# Patient Record
Sex: Male | Born: 1970 | Race: White | Hispanic: No | Marital: Single | Smoking: Former smoker
Health system: Southern US, Community
[De-identification: ages and names within clinical notes are randomized; demographics above are authoritative.]

## PROBLEM LIST (undated history)

## (undated) DIAGNOSIS — F200 Paranoid schizophrenia: Secondary | ICD-10-CM

## (undated) HISTORY — PX: NO PAST SURGERIES: SHX2092

## (undated) HISTORY — DX: Paranoid schizophrenia: F20.0

---

## 2015-04-03 DIAGNOSIS — Z79899 Other long term (current) drug therapy: Secondary | ICD-10-CM | POA: Diagnosis not present

## 2015-04-12 DIAGNOSIS — J209 Acute bronchitis, unspecified: Secondary | ICD-10-CM | POA: Diagnosis not present

## 2015-05-13 DIAGNOSIS — Z79899 Other long term (current) drug therapy: Secondary | ICD-10-CM | POA: Diagnosis not present

## 2015-05-20 DIAGNOSIS — F209 Schizophrenia, unspecified: Secondary | ICD-10-CM | POA: Diagnosis not present

## 2015-06-17 DIAGNOSIS — Z79899 Other long term (current) drug therapy: Secondary | ICD-10-CM | POA: Diagnosis not present

## 2015-07-16 DIAGNOSIS — Z79899 Other long term (current) drug therapy: Secondary | ICD-10-CM | POA: Diagnosis not present

## 2015-08-19 DIAGNOSIS — Z79899 Other long term (current) drug therapy: Secondary | ICD-10-CM | POA: Diagnosis not present

## 2015-09-24 DIAGNOSIS — F209 Schizophrenia, unspecified: Secondary | ICD-10-CM | POA: Diagnosis not present

## 2015-09-24 DIAGNOSIS — Z79899 Other long term (current) drug therapy: Secondary | ICD-10-CM | POA: Diagnosis not present

## 2015-10-24 DIAGNOSIS — Z79899 Other long term (current) drug therapy: Secondary | ICD-10-CM | POA: Diagnosis not present

## 2015-11-27 DIAGNOSIS — Z79899 Other long term (current) drug therapy: Secondary | ICD-10-CM | POA: Diagnosis not present

## 2015-12-31 DIAGNOSIS — Z79899 Other long term (current) drug therapy: Secondary | ICD-10-CM | POA: Diagnosis not present

## 2016-01-28 DIAGNOSIS — F209 Schizophrenia, unspecified: Secondary | ICD-10-CM | POA: Diagnosis not present

## 2016-02-03 DIAGNOSIS — J209 Acute bronchitis, unspecified: Secondary | ICD-10-CM | POA: Diagnosis not present

## 2016-02-03 DIAGNOSIS — Z79899 Other long term (current) drug therapy: Secondary | ICD-10-CM | POA: Diagnosis not present

## 2016-03-04 DIAGNOSIS — Z79899 Other long term (current) drug therapy: Secondary | ICD-10-CM | POA: Diagnosis not present

## 2016-04-06 DIAGNOSIS — Z79899 Other long term (current) drug therapy: Secondary | ICD-10-CM | POA: Diagnosis not present

## 2016-04-14 ENCOUNTER — Other Ambulatory Visit: Payer: Self-pay | Admitting: Family Medicine

## 2016-04-14 ENCOUNTER — Ambulatory Visit (INDEPENDENT_AMBULATORY_CARE_PROVIDER_SITE_OTHER): Payer: PPO | Admitting: Family Medicine

## 2016-04-14 ENCOUNTER — Encounter: Payer: Self-pay | Admitting: Family Medicine

## 2016-04-14 ENCOUNTER — Telehealth: Payer: Self-pay | Admitting: Family Medicine

## 2016-04-14 ENCOUNTER — Encounter (INDEPENDENT_AMBULATORY_CARE_PROVIDER_SITE_OTHER): Payer: Self-pay

## 2016-04-14 ENCOUNTER — Ambulatory Visit (INDEPENDENT_AMBULATORY_CARE_PROVIDER_SITE_OTHER): Payer: PPO

## 2016-04-14 VITALS — BP 111/75 | HR 113 | Temp 98.1°F | Ht 67.0 in | Wt 213.6 lb

## 2016-04-14 DIAGNOSIS — R05 Cough: Secondary | ICD-10-CM

## 2016-04-14 DIAGNOSIS — J189 Pneumonia, unspecified organism: Secondary | ICD-10-CM

## 2016-04-14 DIAGNOSIS — R634 Abnormal weight loss: Secondary | ICD-10-CM | POA: Diagnosis not present

## 2016-04-14 DIAGNOSIS — J181 Lobar pneumonia, unspecified organism: Principal | ICD-10-CM

## 2016-04-14 DIAGNOSIS — R059 Cough, unspecified: Secondary | ICD-10-CM | POA: Insufficient documentation

## 2016-04-14 LAB — COMPREHENSIVE METABOLIC PANEL
ALBUMIN: 3.3 g/dL — AB (ref 3.5–5.2)
ALT: 12 U/L (ref 0–53)
AST: 10 U/L (ref 0–37)
Alkaline Phosphatase: 95 U/L (ref 39–117)
BUN: 4 mg/dL — AB (ref 6–23)
CO2: 27 mEq/L (ref 19–32)
Calcium: 9.1 mg/dL (ref 8.4–10.5)
Chloride: 96 mEq/L (ref 96–112)
Creatinine, Ser: 0.55 mg/dL (ref 0.40–1.50)
GFR: 170.53 mL/min (ref >60.00)
Glucose, Bld: 443 mg/dL — ABNORMAL HIGH (ref 70–99)
POTASSIUM: 3.9 meq/L (ref 3.5–5.1)
SODIUM: 130 meq/L — AB (ref 135–145)
Total Bilirubin: 0.6 mg/dL (ref 0.2–1.2)
Total Protein: 7 g/dL (ref 6.0–8.3)

## 2016-04-14 LAB — LIPID PANEL
CHOLESTEROL: 138 mg/dL (ref 0–200)
HDL: 28.4 mg/dL — AB (ref >39.00)
LDL CALC: 73 mg/dL (ref 0–99)
NonHDL: 109.13
TRIGLYCERIDES: 183 mg/dL — AB (ref 0.0–149.0)
Total CHOL/HDL Ratio: 5
VLDL: 36.6 mg/dL (ref 0.0–40.0)

## 2016-04-14 LAB — SEDIMENTATION RATE: Sed Rate: 127 mm/hr — ABNORMAL HIGH (ref 0–15)

## 2016-04-14 LAB — CBC
HCT: 34.2 % — ABNORMAL LOW (ref 39.0–52.0)
Hemoglobin: 10.9 g/dL — ABNORMAL LOW (ref 13.0–17.0)
MCHC: 32 g/dL (ref 30.0–36.0)
MCV: 81.1 fl (ref 78.0–100.0)
PLATELETS: 483 10*3/uL — AB (ref 150.0–400.0)
RBC: 4.21 Mil/uL — ABNORMAL LOW (ref 4.22–5.81)
RDW: 16.1 % — ABNORMAL HIGH (ref 11.5–15.5)
WBC: 11.9 10*3/uL — ABNORMAL HIGH (ref 4.0–10.5)

## 2016-04-14 LAB — HEMOGLOBIN A1C: Hgb A1c MFr Bld: 15.2 % — ABNORMAL HIGH (ref 4.6–6.5)

## 2016-04-14 LAB — TSH: TSH: 0.83 u[IU]/mL (ref 0.35–4.50)

## 2016-04-14 LAB — PSA: PSA: 0.39 ng/mL (ref 0.10–4.00)

## 2016-04-14 MED ORDER — AMOXICILLIN-POT CLAVULANATE ER 1000-62.5 MG PO TB12: 2.0000 | ORAL_TABLET | Freq: Two times a day (BID) | ORAL | 0 refills | Status: DC

## 2016-04-14 MED ORDER — DOXYCYCLINE HYCLATE 100 MG PO TABS: 100.0000 mg | ORAL_TABLET | Freq: Two times a day (BID) | ORAL | 0 refills | Status: DC

## 2016-04-14 NOTE — Telephone Encounter (Signed)
Patient's father called and informed of xray results - RUL pneumonia. I am concerned about underlying malignancy in context of other symptoms and appearance of xray. Discussed with pulm, Dr. Belia HemanKasa who agrees. Starting on antibiotic and arranging CT chest tomorrow.

## 2016-04-14 NOTE — Assessment & Plan Note (Signed)
New problem. Uncertain etiology at this time. Concern for unintentional weight loss. He has made some adjustments as far as portion control but has made no other changes. Patient as well as his father are concerned that the amount of weight loss exceeds his adjustments regarding portion control. No other alarming or red flag symptoms. Obtaining laboratory workup. See orders. Also obtaining chest x-ray.

## 2016-04-14 NOTE — Progress Notes (Signed)
Subjective:  Patient ID: Bobby Clements, male    DOB: 1970/03/19  Age: 46 y.o. MRN: [id number]  CC: Establish care - Weight loss, cough  HPI Bobby Clements is a 46 y.o. male presents to the clinic today with the above issues.  Weight loss  Patient accompanied by his adopted father today.  Patient and father report he's had a 50 pound weight loss over the past year.  Patient states that he has cut back on his portion sizes but has made no other changes to prompt weight loss.  He does not exercise.  He and his father are concerned about the extent of his weight loss although it was needed.  They concern about underlying reasons for unintentional weight loss  No reports of fevers or chills or night sweats.  No abdominal pain, hematochezia, melena.  He is a smoker.  He has had an ongoing cough for the past month. See review of systems for other reported symptoms.  He is followed closely by psychiatry. No reports of other laboratory studies other than his monthly CBC due to clozapine use.  Cough  Patient has had a cough for the past month.  Nonproductive.  He is a smoker.  He has had some intermittent right-sided chest pain as well. Does not appear to be associated with exertion. No no relieving factors. Sometimes associated with cough.  Cough comes and goes.  No medications or interventions tried.  PMH, Surgical Hx, Family Hx, Social History reviewed and updated as below.  Past Medical History:  Diagnosis Date  . Paranoid schizophrenia [facility])    Past Surgical History:  Procedure Laterality Date  . NO PAST SURGERIES     Family History  Problem Relation Age of Onset  . Adopted: Yes   Social History  Substance Use Topics  . Smoking status: Current Every Day Smoker  . Smokeless tobacco: Never Used  . Alcohol use No   Review of Systems  Constitutional:       Weight loss.  Respiratory: Positive for cough.   Cardiovascular: Positive for chest pain.    Gastrointestinal: Positive for constipation.  Genitourinary: Positive for frequency.  Neurological: Positive for weakness.  Psychiatric/Behavioral:       Stress.  All other systems reviewed and are negative.  Objective:   Today's Vitals: BP 111/75   Pulse (!) 113   Temp 98.1 F (36.7 C) (Oral)   Ht _0  (1.702 m)   Wt 213 lb 9.6 oz (96.9 kg)   SpO2 98%   BMI 33.45 kg/m   Physical Exam  Constitutional: He is oriented to person, place, and time. He appears well-developed. No distress.  HENT:  Head: Normocephalic and atraumatic.  Mouth/Throat: Oropharynx is clear and moist.  TMs partially obscured by cerumen bilaterally.  Neck: Neck supple.  Cardiovascular: Regular rhythm.   Tachycardia.  Pulmonary/Chest: Effort normal and breath sounds normal. He has no wheezes. He has no rales.  Abdominal: Soft. He exhibits no distension.  No discrete area of tenderness (patient does not like to be touched).  Musculoskeletal: Normal range of motion.  Lymphadenopathy:    He has no cervical adenopathy.  Neurological: He is alert and oriented to person, place, and time.  Skin: Skin is warm. No rash noted.  Psychiatric:  Flat affect. Depressed mood. Minimal eye contact.   Vitals reviewed.  Assessment & Plan:   Problem List Items Addressed This Visit    Weight loss - Primary    New problem. Uncertain  etiology at this time. Concern for unintentional weight loss. He has made some adjustments as far as portion control but has made no other changes. Patient as well as his father are concerned that the amount of weight loss exceeds his adjustments regarding portion control. No other alarming or red flag symptoms. Obtaining laboratory workup. See orders. Also obtaining chest x-ray.       Relevant Orders   CBC   Hemoglobin A1c   Comprehensive metabolic panel   Lipid panel   TSH   Sed Rate (ESR)   HIV antibody   Hepatitis C Antibody   PSA   DG Chest 2 View   Cough    Prompting  her next line exam unremarkable. Obtaining chest x-ray given persistence of symptoms and concerns regarding weight loss.      Relevant Orders   DG Chest 2 View     Outpatient Encounter Prescriptions as of 04/14/2016  Medication Sig  . cloZAPine (CLOZARIL) 100 MG tablet Take 100 mg by mouth 2 (two) times daily. Take one tablet in the morning and two tablets at night  . FLUoxetine (PROZAC) 40 MG capsule Take 40 mg by mouth at bedtime.    No facility-administered encounter medications on file as of 04/14/2016.     Follow-up: Pending lab and xray results  [place]

## 2016-04-14 NOTE — Assessment & Plan Note (Signed)
Prompting her next line exam unremarkable. Obtaining chest x-ray given persistence of symptoms and concerns regarding weight loss.

## 2016-04-14 NOTE — Patient Instructions (Signed)
We will call with the lab results.  We will schedule follow up after the results return.  Continue your meds.  Take care  Dr. Adriana Simasook

## 2016-04-15 ENCOUNTER — Other Ambulatory Visit: Payer: PPO

## 2016-04-15 ENCOUNTER — Encounter: Payer: Self-pay | Admitting: Family Medicine

## 2016-04-15 ENCOUNTER — Ambulatory Visit
Admission: RE | Admit: 2016-04-15 | Discharge: 2016-04-15 | Disposition: A | Discharge status: Home / Self Care | Payer: PPO | Source: Ambulatory Visit | Attending: Family Medicine | Admitting: Family Medicine

## 2016-04-15 DIAGNOSIS — I2584 Coronary atherosclerosis due to calcified coronary lesion: Secondary | ICD-10-CM

## 2016-04-15 DIAGNOSIS — J850 Gangrene and necrosis of lung: Secondary | ICD-10-CM | POA: Diagnosis not present

## 2016-04-15 DIAGNOSIS — J189 Pneumonia, unspecified organism: Secondary | ICD-10-CM

## 2016-04-15 DIAGNOSIS — R161 Splenomegaly, not elsewhere classified: Secondary | ICD-10-CM | POA: Diagnosis not present

## 2016-04-15 DIAGNOSIS — R918 Other nonspecific abnormal finding of lung field: Secondary | ICD-10-CM | POA: Diagnosis not present

## 2016-04-15 DIAGNOSIS — I313 Pericardial effusion (noninflammatory): Secondary | ICD-10-CM | POA: Insufficient documentation

## 2016-04-15 DIAGNOSIS — D649 Anemia, unspecified: Secondary | ICD-10-CM | POA: Insufficient documentation

## 2016-04-15 DIAGNOSIS — J470 Bronchiectasis with acute lower respiratory infection: Secondary | ICD-10-CM | POA: Insufficient documentation

## 2016-04-15 DIAGNOSIS — I2510 Atherosclerotic heart disease of native coronary artery without angina pectoris: Secondary | ICD-10-CM | POA: Insufficient documentation

## 2016-04-15 DIAGNOSIS — E118 Type 2 diabetes mellitus with unspecified complications: Secondary | ICD-10-CM | POA: Insufficient documentation

## 2016-04-15 DIAGNOSIS — J181 Lobar pneumonia, unspecified organism: Secondary | ICD-10-CM | POA: Insufficient documentation

## 2016-04-15 DIAGNOSIS — E119 Type 2 diabetes mellitus without complications: Secondary | ICD-10-CM | POA: Insufficient documentation

## 2016-04-15 LAB — HEPATITIS C ANTIBODY: HCV Ab: NEGATIVE

## 2016-04-15 LAB — HIV ANTIBODY (ROUTINE TESTING W REFLEX): HIV 1&2 Ab, 4th Generation: NONREACTIVE

## 2016-04-17 ENCOUNTER — Ambulatory Visit (INDEPENDENT_AMBULATORY_CARE_PROVIDER_SITE_OTHER): Payer: PPO | Admitting: Family Medicine

## 2016-04-17 ENCOUNTER — Encounter: Payer: Self-pay | Admitting: Family Medicine

## 2016-04-17 VITALS — BP 109/77 | HR 108 | Temp 98.5°F | Wt 212.8 lb

## 2016-04-17 DIAGNOSIS — E119 Type 2 diabetes mellitus without complications: Secondary | ICD-10-CM | POA: Diagnosis not present

## 2016-04-17 DIAGNOSIS — D649 Anemia, unspecified: Secondary | ICD-10-CM | POA: Diagnosis not present

## 2016-04-17 DIAGNOSIS — J181 Lobar pneumonia, unspecified organism: Secondary | ICD-10-CM

## 2016-04-17 DIAGNOSIS — R634 Abnormal weight loss: Secondary | ICD-10-CM | POA: Diagnosis not present

## 2016-04-17 DIAGNOSIS — J189 Pneumonia, unspecified organism: Secondary | ICD-10-CM

## 2016-04-17 DIAGNOSIS — Z111 Encounter for screening for respiratory tuberculosis: Secondary | ICD-10-CM | POA: Diagnosis not present

## 2016-04-17 DIAGNOSIS — F200 Paranoid schizophrenia: Secondary | ICD-10-CM | POA: Insufficient documentation

## 2016-04-17 LAB — CBC
HCT: 33 % — ABNORMAL LOW (ref 39.0–52.0)
Hemoglobin: 10.7 g/dL — ABNORMAL LOW (ref 13.0–17.0)
MCHC: 32.4 g/dL (ref 30.0–36.0)
MCV: 80.6 fl (ref 78.0–100.0)
PLATELETS: 501 10*3/uL — AB (ref 150.0–400.0)
RBC: 4.09 Mil/uL — AB (ref 4.22–5.81)
RDW: 16.5 % — ABNORMAL HIGH (ref 11.5–15.5)
WBC: 9.2 10*3/uL (ref 4.0–10.5)

## 2016-04-17 LAB — FOLATE: FOLATE: 13.2 ng/mL (ref >5.9)

## 2016-04-17 LAB — VITAMIN B12: Vitamin B-12: 735 pg/mL (ref 211–911)

## 2016-04-17 MED ORDER — BASAGLAR KWIKPEN 100 UNIT/ML ~~LOC~~ SOPN: PEN_INJECTOR | SUBCUTANEOUS | 0 refills | Status: DC

## 2016-04-17 MED ORDER — METFORMIN HCL 500 MG PO TABS: 500.0000 mg | ORAL_TABLET | Freq: Two times a day (BID) | ORAL | 3 refills | Status: DC

## 2016-04-17 NOTE — Assessment & Plan Note (Signed)
Likely secondary to underlying diabetes and ongoing infection.

## 2016-04-17 NOTE — Assessment & Plan Note (Signed)
Additional lab studies today.

## 2016-04-17 NOTE — Assessment & Plan Note (Signed)
New problem. Tolerating antibiotic. Given CT findings and presentation, I discussed this case with Dr. Sung AmabileSimonds, pulmonology. He expressed concern about TB. Will arrange for Quanterferon gold. If negative will need bronchoscopy per his rectum-good We'll continue with treatment for CAP at this time.

## 2016-04-17 NOTE — Progress Notes (Signed)
Subjective:  Patient ID: Bobby Clements, male    DOB: 02-06-1971  Age: 46 y.o. MRN: [id number]  CC: Follow up  HPI:  46 year old male with paranoid schizophrenia presents for follow-up.  Patient was recently seen on 1/30 to establish care. He had complaints of weight loss and cough.  Chest xray was obtained and revealed RUL pneumonia. Given appearance and weight loss, CT was obtained. It revealed. Dense right upper lobe pneumonia and evidence of bronchiectasis. Also revealed right middle and lower lobe opacity. Additionally, there was a nodular opacity left upper lobe. Lymphadenopathy was noted and was thought to be reactive. Additionally, splenomegaly was noted. He was started on treatment for CAP with Augmentin and Doxy.  His laboratory studies returned revealing new-onset diabetes with A1c of 15.2. It also revealed leukocytosis, thrombocytosis and mild anemia.   Patient presents today for follow-up. Patient reports cough but states that he is "tired". Tolerating antibiotics. Father states that he's been compliant with his medication. No fever. No chills. No reports of shortness of breath. Patient has no other complaints at this time. We will discuss the above in detail.   Social Hx   Social History   Social History  . Marital status: Single    Spouse name: N/A  . Number of children: N/A  . Years of education: N/A   Social History Main Topics  . Smoking status: Current Every Day Smoker  . Smokeless tobacco: Never Used  . Alcohol use No  . Drug use: No  . Sexual activity: No   Other Topics Concern  . None   Social History Narrative  . None    Review of Systems  Constitutional: Negative for fever.  Respiratory: Positive for cough.    Objective:  BP 109/77   Pulse (!) 108   Temp 98.5 F (36.9 C) (Oral)   Wt 212 lb 12.8 oz (96.5 kg)   SpO2 98%   BMI 33.33 kg/m   BP/Weight 04/17/2016 04/14/2016  Systolic BP 109 111  Diastolic BP 77 75  Wt. (Lbs) 212.8 213.6  BMI  33.33 33.45   Physical Exam  Constitutional: He appears well-developed. No distress.  HENT:  Head: Normocephalic and atraumatic.  Pulmonary/Chest: Effort normal.  Neurological: He is alert.  Psychiatric:  Slightly paranoid/perseverative today.  Vitals reviewed.  Lab Results  Component Value Date   WBC 11.9 (H) 04/14/2016   HGB 10.9 (L) 04/14/2016   HCT 34.2 (L) 04/14/2016   PLT 483.0 (H) 04/14/2016   GLUCOSE 443 (H) 04/14/2016   CHOL 138 04/14/2016   TRIG 183.0 (H) 04/14/2016   HDL 28.40 (L) 04/14/2016   LDLCALC 73 04/14/2016   ALT 12 04/14/2016   AST 10 04/14/2016   NA 130 (L) 04/14/2016   K 3.9 04/14/2016   CL 96 04/14/2016   CREATININE 0.55 04/14/2016   BUN 4 (L) 04/14/2016   CO2 27 04/14/2016   TSH 0.83 04/14/2016   PSA 0.39 04/14/2016   HGBA1C 15.2 (H) 04/14/2016    Assessment & Plan:   Problem List Items Addressed This Visit    Weight loss    Likely secondary to underlying diabetes and ongoing infection.      New onset type 2 diabetes mellitus (HCC)    Starting metformin and basaglar today.       Relevant Medications   metFORMIN (GLUCOPHAGE) 500 MG tablet   Insulin Glargine (BASAGLAR KWIKPEN) 100 UNIT/ML SOPN   CAP (community acquired pneumonia) - Primary    New problem. Tolerating  antibiotic. Given CT findings and presentation, I discussed this case with Dr. Sung AmabileSimonds, pulmonology. He expressed concern about TB. Will arrange for Quanterferon gold. If negative will need bronchoscopy per his rectum-good We'll continue with treatment for CAP at this time.       Anemia    Additional lab studies today.      Relevant Orders   CBC   Iron, TIBC and Ferritin Panel   B12   Folate    Other Visit Diagnoses    Screening-pulmonary TB       Relevant Orders   Quantiferon tb gold assay     Meds ordered this encounter  Medications  . metFORMIN (GLUCOPHAGE) 500 MG tablet    Sig: Take 1 tablet (500 mg total) by mouth 2 (two) times daily with a meal.     Dispense:  180 tablet    Refill:  3  . Insulin Glargine (BASAGLAR KWIKPEN) 100 UNIT/ML SOPN    Sig: 20 units daily. Increase per physician recommendations.    Dispense:  2 pen    Refill:  0    Follow-up: 1 week.  Everlene OtherJayce Trong Gosling DO [facility] [place]

## 2016-04-17 NOTE — Patient Instructions (Addendum)
Start at 20 units (daily).  Increase by 2 units daily until his sugars are below 150.   I will see him back next week.  Take care  Dr. Adriana Clements   Diabetes Mellitus and Food It is important for you to manage your blood sugar (glucose) level. Your blood glucose level can be greatly affected by what you eat. Eating healthier foods in the appropriate amounts throughout the day at about the same time each day will help you control your blood glucose level. It can also help slow or prevent worsening of your diabetes mellitus. Healthy eating may even help you improve the level of your blood pressure and reach or maintain a healthy weight. General recommendations for healthful eating and cooking habits include:  Eating meals and snacks regularly. Avoid going long periods of time without eating to lose weight.  Eating a diet that consists mainly of plant-based foods, such as fruits, vegetables, nuts, legumes, and whole grains.  Using low-heat cooking methods, such as baking, instead of high-heat cooking methods, such as deep frying. Work with your dietitian to make sure you understand how to use the Nutrition Facts information on food labels. How can food affect me? Carbohydrates  Carbohydrates affect your blood glucose level more than any other type of food. Your dietitian will help you determine how many carbohydrates to eat at each meal and teach you how to count carbohydrates. Counting carbohydrates is important to keep your blood glucose at a healthy level, especially if you are using insulin or taking certain medicines for diabetes mellitus. Alcohol  Alcohol can cause sudden decreases in blood glucose (hypoglycemia), especially if you use insulin or take certain medicines for diabetes mellitus. Hypoglycemia can be a life-threatening condition. Symptoms of hypoglycemia (sleepiness, dizziness, and disorientation) are similar to symptoms of having too much alcohol. If your health care provider has  given you approval to drink alcohol, do so in moderation and use the following guidelines:  Women should not have more than one drink per day, and men should not have more than two drinks per day. One drink is equal to:  12 oz of beer.  5 oz of wine.  1 oz of hard liquor.  Do not drink on an empty stomach.  Keep yourself hydrated. Have water, diet soda, or unsweetened iced tea.  Regular soda, juice, and other mixers might contain a lot of carbohydrates and should be counted. What foods are not recommended? As you make food choices, it is important to remember that all foods are not the same. Some foods have fewer nutrients per serving than other foods, even though they might have the same number of calories or carbohydrates. It is difficult to get your body what it needs when you eat foods with fewer nutrients. Examples of foods that you should avoid that are high in calories and carbohydrates but low in nutrients include:  Trans fats (most processed foods list trans fats on the Nutrition Facts label).  Regular soda.  Juice.  Candy.  Sweets, such as cake, pie, doughnuts, and cookies.  Fried foods. What foods can I eat? Eat nutrient-rich foods, which will nourish your body and keep you healthy. The food you should eat also will depend on several factors, including:  The calories you need.  The medicines you take.  Your weight.  Your blood glucose level.  Your blood pressure level.  Your cholesterol level. You should eat a variety of foods, including:  Protein.  Lean cuts of meat.  Proteins  low in saturated fats, such as fish, egg whites, and beans. Avoid processed meats.  Fruits and vegetables.  Fruits and vegetables that may help control blood glucose levels, such as apples, mangoes, and yams.  Dairy products.  Choose fat-free or low-fat dairy products, such as milk, yogurt, and cheese.  Grains, bread, pasta, and rice.  Choose whole grain products, such  as multigrain bread, whole oats, and brown rice. These foods may help control blood pressure.  Fats.  Foods containing healthful fats, such as nuts, avocado, olive oil, canola oil, and fish. Does everyone with diabetes mellitus have the same meal plan? Because every person with diabetes mellitus is different, there is not one meal plan that works for everyone. It is very important that you meet with a dietitian who will help you create a meal plan that is just right for you. This information is not intended to replace advice given to you by your health care provider. Make sure you discuss any questions you have with your health care provider. Document Released: 11/27/2004 Document Revised: 08/08/2015 Document Reviewed: 01/27/2013 Elsevier Interactive Patient Education  2017 [employer].   Tips for Eating Away From Home If You Have Diabetes Controlling your level of blood glucose, also known as blood sugar, can be challenging. It can be even more difficult when you do not prepare your own meals. The following tips can help you manage your diabetes when you eat away from home. Planning ahead Plan ahead if you know you will be eating away from home:  Ask your health care provider how to time meals and medicine if you are taking insulin.  Make a list of restaurants near you that offer healthy choices. If they have a carry-out menu, take it home and plan what you will order ahead of time.  Look up the restaurant you want to eat at online. Many chain and fast-food restaurants list nutritional information online. Use this information to choose the healthiest options and to calculate how many carbohydrates will be in your meal.  Use a carbohydrate-counting book or mobile app to look up the carbohydrate content and serving size of the foods you want to eat.  Become familiar with serving sizes and learn to recognize how many servings are in a portion. This will allow you to estimate how many  carbohydrates you can eat. Free foods A "free food" is any food or drink that has less than 5 g of carbohydrates per serving. Free foods include:  Many vegetables.  Hard boiled eggs.  Nuts or seeds.  Olives.  Cheeses.  Meats. These types of foods make good appetizer choices and are often available at salad bars. Lemon juice, vinegar, or a low-calorie salad dressing of fewer than 20 calories per serving can be used as a "free" salad dressing. Choices to reduce carbohydrates  Substitute nonfat sweetened yogurt with a sugar-free yogurt. Yogurt made from soy milk may also be used, but you will still want a sugar-free or plain option to choose a lower carbohydrate amount.  Ask your server to take away the bread basket or chips from your table.  Order fresh fruit. A salad bar often offers fresh fruit choices. Avoid canned fruit because it is usually packed in sugar or syrup.  Order a salad, and eat it without dressing. Or, create a "free" salad dressing.  Ask for substitutions. For example, instead of [place] fries, request an order of a vegetable such as salad, green beans, or broccoli. Other tips  If  you take insulin, take the insulin once your food arrives to your table. This will ensure your insulin and food are timed correctly.  Ask your server about the portion size before your order, and ask for a take-out box if the portion has more servings than you should have. When your food comes, leave the amount you should have on the plate, and put the rest in the take-out box.  Consider splitting an entree with someone and ordering a side salad. This information is not intended to replace advice given to you by your health care provider. Make sure you discuss any questions you have with your health care provider. Document Released: 03/02/2005 Document Revised: 08/08/2015 Document Reviewed: 05/30/2013 Elsevier Interactive Patient Education  2017 [employer]

## 2016-04-17 NOTE — Assessment & Plan Note (Signed)
Starting metformin and basaglar today.

## 2016-04-18 LAB — IRON,TIBC AND FERRITIN PANEL
%SAT: 13 % — ABNORMAL LOW (ref 15–60)
Ferritin: 215 ng/mL (ref 20–380)
Iron: 26 ug/dL — ABNORMAL LOW (ref 50–180)
TIBC: 195 ug/dL — ABNORMAL LOW (ref 250–425)

## 2016-04-20 ENCOUNTER — Other Ambulatory Visit: Payer: Self-pay | Admitting: Family Medicine

## 2016-04-20 ENCOUNTER — Telehealth: Payer: Self-pay | Admitting: Family Medicine

## 2016-04-20 LAB — QUANTIFERON TB GOLD ASSAY (BLOOD)
INTERFERON GAMMA RELEASE ASSAY: NEGATIVE
MITOGEN-NIL SO: 1.73 [IU]/mL
QUANTIFERON NIL VALUE: 0.05 [IU]/mL
Quantiferon Tb Ag Minus Nil Value: <0 IU/mL

## 2016-04-20 MED ORDER — FERROUS SULFATE 325 (65 FE) MG PO TABS: 325.0000 mg | ORAL_TABLET | Freq: Two times a day (BID) | ORAL | 1 refills | Status: DC

## 2016-04-20 NOTE — Telephone Encounter (Signed)
Pt father Moise BoringDonnie called and needed to speak with you in regards to something that you were supposed to get back with him last week with. Please advise, thank you!  Call Donnie @ [phone]

## 2016-04-20 NOTE — Telephone Encounter (Signed)
pts mother was called per DPR. She was told we had not received results on the lab that was done on Friday. she was told we'd call once they were resulted.

## 2016-04-20 NOTE — Telephone Encounter (Signed)
pts mother was called and told that per [facility] it takes up to four days to run the test. Mother was advised that we would let them know right away when we received results.

## 2016-04-20 NOTE — Telephone Encounter (Signed)
Results received and given to mother.

## 2016-04-20 NOTE — Telephone Encounter (Signed)
Called pts father back was unable to leave voicemail.

## 2016-04-21 ENCOUNTER — Other Ambulatory Visit: Payer: Self-pay | Admitting: Family Medicine

## 2016-04-21 DIAGNOSIS — J189 Pneumonia, unspecified organism: Secondary | ICD-10-CM

## 2016-04-21 DIAGNOSIS — J181 Lobar pneumonia, unspecified organism: Principal | ICD-10-CM

## 2016-04-23 ENCOUNTER — Ambulatory Visit (INDEPENDENT_AMBULATORY_CARE_PROVIDER_SITE_OTHER): Payer: PPO | Admitting: Family Medicine

## 2016-04-23 ENCOUNTER — Encounter: Payer: Self-pay | Admitting: Family Medicine

## 2016-04-23 DIAGNOSIS — E119 Type 2 diabetes mellitus without complications: Secondary | ICD-10-CM

## 2016-04-23 DIAGNOSIS — J181 Lobar pneumonia, unspecified organism: Secondary | ICD-10-CM | POA: Diagnosis not present

## 2016-04-23 DIAGNOSIS — J189 Pneumonia, unspecified organism: Secondary | ICD-10-CM

## 2016-04-23 NOTE — Progress Notes (Signed)
   Subjective:  Patient ID: Bobby Clements, male    DOB: 07/29/1970  Age: 46 y.o. MRN: [id number]  CC: Follow up CAP, [id number]  HPI:  46 year old male with paranoid schizophrenia, recent CAP, and new-onset type 2 diabetes presents for follow-up.  CAP  Cough is improved. Patient states that he is feeling better at this time.  He has upcoming appointment with pulmonology for consultation given CT findings.  He has completed antibiotics.  DM-2 Blood sugars readings - Improving. That went from the 500s fasting to the 150's over the past few days.  Hypoglycemia - No. Medications - Metformin, Basaglar. Adverse effects - No. Compliance - Yes.   Social Hx   Social History   Social History  . Marital status: Single    Spouse name: N/A  . Number of children: N/A  . Years of education: N/A   Social History Main Topics  . Smoking status: Current Every Day Smoker  . Smokeless tobacco: Never Used  . Alcohol use No  . Drug use: No  . Sexual activity: No   Other Topics Concern  . None   Social History Narrative  . None   Review of Systems  Constitutional: Negative for fever.  Respiratory: Positive for cough.    Objective:  BP 104/71   Pulse 92   Temp 98.4 F (36.9 C) (Oral)   Wt 209 lb (94.8 kg)   SpO2 97%   BMI 32.73 kg/m   BP/Weight 04/23/2016 04/17/2016 04/14/2016  Systolic BP 104 109 111  Diastolic BP 71 77 75  Wt. (Lbs) 209 212.8 213.6  BMI 32.73 33.33 33.45   Physical Exam  Constitutional: He is oriented to person, place, and time. He appears well-developed. No distress.  Cardiovascular: Normal rate and regular rhythm.   Pulmonary/Chest: Effort normal and breath sounds normal. He has no wheezes. He has no rales.  Neurological: He is alert and oriented to person, place, and time.  Psychiatric:  Flat affect.  Vitals reviewed.  Lab Results  Component Value Date   WBC 9.2 04/17/2016   HGB 10.7 (L) 04/17/2016   HCT 33.0 (L) 04/17/2016   PLT 501.0 (H) 04/17/2016    GLUCOSE 443 (H) 04/14/2016   CHOL 138 04/14/2016   TRIG 183.0 (H) 04/14/2016   HDL 28.40 (L) 04/14/2016   LDLCALC 73 04/14/2016   ALT 12 04/14/2016   AST 10 04/14/2016   NA 130 (L) 04/14/2016   K 3.9 04/14/2016   CL 96 04/14/2016   CREATININE 0.55 04/14/2016   BUN 4 (L) 04/14/2016   CO2 27 04/14/2016   TSH 0.83 04/14/2016   PSA 0.39 04/14/2016   HGBA1C 15.2 (H) 04/14/2016    Assessment & Plan:   Problem List Items Addressed This Visit    CAP (community acquired pneumonia)    Cough improved. Stable and doing well at this time. Has completed antibiotics.  Seeing pulm tomorrow given CT findings. Had negative quanterferon gold.      DM (diabetes mellitus), type 2 (HCC)    Improving. Last 3 days fasting has been 140's - 150's. Currently on 30 units of Basaglar. Advised to continue and titrate up per my recommendations if CBG's >150. Continue Metformin.        Follow-up: 2 weeks  Everlene OtherJayce Torii Royse DO [facility] [place]

## 2016-04-23 NOTE — Progress Notes (Signed)
Pre visit review using our clinic review tool, if applicable. No additional management support is needed unless otherwise documented below in the visit note. 

## 2016-04-23 NOTE — Assessment & Plan Note (Addendum)
Improving. Last 3 days fasting has been 140's - 150's. Currently on 30 units of Basaglar. Advised to continue and titrate up per my recommendations if CBG's >150. Continue Metformin.

## 2016-04-23 NOTE — Patient Instructions (Signed)
Continue insulin as we discussed.  Follow up in 2 weeks.  Take care  Dr. Adriana Simasook

## 2016-04-23 NOTE — Assessment & Plan Note (Signed)
Cough improved. Stable and doing well at this time. Has completed antibiotics.  Seeing pulm tomorrow given CT findings. Had negative quanterferon gold.

## 2016-04-24 ENCOUNTER — Ambulatory Visit (INDEPENDENT_AMBULATORY_CARE_PROVIDER_SITE_OTHER): Payer: PPO | Admitting: Pulmonary Disease

## 2016-04-24 ENCOUNTER — Ambulatory Visit: Payer: PPO | Admitting: Family Medicine

## 2016-04-24 ENCOUNTER — Encounter: Payer: Self-pay | Admitting: Pulmonary Disease

## 2016-04-24 VITALS — BP 118/80 | HR 80 | Wt 208.0 lb

## 2016-04-24 DIAGNOSIS — J181 Lobar pneumonia, unspecified organism: Secondary | ICD-10-CM

## 2016-04-24 DIAGNOSIS — F17200 Nicotine dependence, unspecified, uncomplicated: Secondary | ICD-10-CM

## 2016-04-24 DIAGNOSIS — R911 Solitary pulmonary nodule: Secondary | ICD-10-CM

## 2016-04-24 DIAGNOSIS — J189 Pneumonia, unspecified organism: Secondary | ICD-10-CM

## 2016-04-24 NOTE — Progress Notes (Signed)
PULMONARY CONSULT NOTE  Requesting MD/Service: Adriana Simas Date of initial consultation: 04/24/16 Reason for consultation: RUL consolidation  PT PROFILE: 35 M smoker with schizophrenia (never institutionalized) referred for evaluation of RUL consolidation and concern raised for TB  HPI:  70 M smoker of 1/2 PPD initially developed symptoms of a "cold" in November 2017 with malaise, NP cough, no fever. These symptoms resolved. In January he again developed malaise, fatigue, cough productive of yellow mucus. He denies fever and hemoptysis. He underwent CXR revealing RUL consolidation and CT scan following that study. He has just completed treatment for CAP with Augmentin and doxycycline prescribed by Dr Adriana Simas. He indicates that he feels better with only vague R upper chest discomfort now (last episode 3 days ago). He has undergone quant gold test which was negative. He is referred for further evaluation and consideration of bronchoscopy.   Past Medical History:  Diagnosis Date  . Paranoid schizophrenia [facility])     Past Surgical History:  Procedure Laterality Date  . NO PAST SURGERIES      MEDICATIONS: I have reviewed all medications and confirmed regimen as documented  Social History   Social History  . Marital status: Single    Spouse name: N/A  . Number of children: N/A  . Years of education: N/A   Occupational History  . Not on file.   Social History Main Topics  . Smoking status: Current Every Day Smoker    Packs/day: 1.00  . Smokeless tobacco: Never Used  . Alcohol use No  . Drug use: No  . Sexual activity: No   Other Topics Concern  . Not on file   Social History Narrative  . No narrative on file    Family History  Problem Relation Age of Onset  . Adopted: Yes    ROS: No fever, myalgias/arthralgias, unexplained weight loss or weight gain No new focal weakness or sensory deficits No otalgia, hearing loss, visual changes, nasal and sinus symptoms, mouth and throat  problems No neck pain or adenopathy No abdominal pain, N/V/D, diarrhea, change in bowel pattern No dysuria, change in urinary pattern   Vitals:   04/24/16 1104  BP: 118/80  Pulse: 80  SpO2: 98%  Weight: 208 lb (94.3 kg)     EXAM:  Gen: Flat affect, WDWN, No overt respiratory distress HEENT: NCAT, sclera white, oropharynx normal Neck: Supple without LAN, thyromegaly, JVD Lungs: breath sounds full with bronchial quality in RUL anteriorly. No wheezes or other adventitious sounds Cardiovascular: RRR, no murmurs noted Abdomen: Soft, nontender, normal BS Ext: without clubbing, cyanosis, edema Neuro: CNs grossly intact, motor and sensory intact Skin: Limited exam, no lesions noted  DATA:   BMP Latest Ref Rng & Units 04/14/2016  Glucose 70 - 99 mg/dL 161(W)  BUN 6 - 23 mg/dL 4(L)  Creatinine 9.60 - 1.50 mg/dL 4.54  Sodium 098 - 119 mEq/L 130(L)  Potassium 3.5 - 5.1 mEq/L 3.9  Chloride 96 - 112 mEq/L 96  CO2 19 - 32 mEq/L 27  Calcium 8.4 - 10.5 mg/dL 9.1    CBC Latest Ref Rng & Units 04/17/2016 04/14/2016  WBC 4.0 - 10.5 K/uL 9.2 11.9(H)  Hemoglobin 13.0 - 17.0 g/dL 10.7(L) 10.9(L)  Hematocrit 39.0 - 52.0 % 33.0(L) 34.2(L)  Platelets 150.0 - 400.0 K/uL 501.0(H) 483.0(H)   Quantiferon gold 04/17/16: negative  CXR:    IMPRESSION:     ICD-9-CM ICD-10-CM   1. Community acquired pneumonia of right upper lobe of lung (HCC) 481 J18.1  2. Lung nodule, Left 793.11 R91.1   3. Smoker 305.1 F17.200    The negative quantiferon gold study and the clinical response to antibiotics directed @ CAP are encouraging  PLAN:  Follow up in 3-4 weeks with CXR. If RUL is clearing, we will consider this CAP. He will still need follow up for the L lung nodule with repeat CT chest in 3 months or so. Encouraged him to quit smoking   Billy Fischeravid Naidelyn Parrella, MD PCCM service Mobile [phone] Pager [phone] 04/24/2016

## 2016-04-24 NOTE — Patient Instructions (Signed)
Follow up in 3-4 weeks with chest Xray prior to that visit

## 2016-05-07 DIAGNOSIS — Z79899 Other long term (current) drug therapy: Secondary | ICD-10-CM | POA: Diagnosis not present

## 2016-05-08 ENCOUNTER — Ambulatory Visit (INDEPENDENT_AMBULATORY_CARE_PROVIDER_SITE_OTHER): Payer: PPO | Admitting: Family Medicine

## 2016-05-08 ENCOUNTER — Encounter: Payer: Self-pay | Admitting: Family Medicine

## 2016-05-08 ENCOUNTER — Ambulatory Visit (INDEPENDENT_AMBULATORY_CARE_PROVIDER_SITE_OTHER): Payer: PPO

## 2016-05-08 VITALS — BP 119/83 | HR 95 | Temp 97.7°F | Wt 207.4 lb

## 2016-05-08 DIAGNOSIS — J189 Pneumonia, unspecified organism: Secondary | ICD-10-CM | POA: Diagnosis not present

## 2016-05-08 DIAGNOSIS — D649 Anemia, unspecified: Secondary | ICD-10-CM | POA: Diagnosis not present

## 2016-05-08 DIAGNOSIS — E119 Type 2 diabetes mellitus without complications: Secondary | ICD-10-CM

## 2016-05-08 DIAGNOSIS — J181 Lobar pneumonia, unspecified organism: Secondary | ICD-10-CM

## 2016-05-08 MED ORDER — BASAGLAR KWIKPEN 100 UNIT/ML ~~LOC~~ SOPN: PEN_INJECTOR | SUBCUTANEOUS | 3 refills | Status: DC

## 2016-05-08 MED ORDER — INSULIN PEN NEEDLE 31G X 5 MM MISC: 4 refills | Status: DC

## 2016-05-08 NOTE — Progress Notes (Signed)
Pre visit review using our clinic review tool, if applicable. No additional management support is needed unless otherwise documented below in the visit note. 

## 2016-05-08 NOTE — Progress Notes (Signed)
Subjective:  Patient ID: Bobby LaymanJason G Nunley, male    DOB: 02/03/1971  Age: 46 y.o. MRN: [id number]  CC: Follow up  HPI:  46 year old male with paranoid schizophrenic, recent diagnosis of DM 2, recent community acquired pneumonia, and anemia presents for follow-up.  DM-2  Sugars markedly improved. Now ranging in the 70s to low 90s.  Compliant with insulin and metformin. He's taking basaglar 30 units daily.  No side effects. No hypoglycemia.  His recent weight loss has now stopped. His weight is essentially stable.  CAP  No further cough. He is feeling well.  Needs chest x-ray today.  Anemia  Anemia has been stable.  Tolerating iron therapy.  No reports of hematochezia or melena. Normal bowel movements.  Social Hx   Social History   Social History  . Marital status: Single    Spouse name: N/A  . Number of children: N/A  . Years of education: N/A   Social History Main Topics  . Smoking status: Current Every Day Smoker    Packs/day: 1.00  . Smokeless tobacco: Never Used  . Alcohol use No  . Drug use: No  . Sexual activity: No   Other Topics Concern  . None   Social History Narrative  . None    Review of Systems  Constitutional: Negative.   Respiratory: Negative for cough.    Objective:  BP 119/83   Pulse 95   Temp 97.7 F (36.5 C) (Oral)   Wt 207 lb 6.4 oz (94.1 kg)   SpO2 99%   BMI 32.48 kg/m   BP/Weight 05/08/2016 04/24/2016 04/23/2016  Systolic BP 119 118 104  Diastolic BP 83 80 71  Wt. (Lbs) 207.4 208 209  BMI 32.48 32.58 32.73   Physical Exam  Constitutional: He is oriented to person, place, and time. He appears well-developed. No distress.  Cardiovascular: Normal rate and regular rhythm.   Pulmonary/Chest: Effort normal and breath sounds normal.  Neurological: He is alert and oriented to person, place, and time.  Psychiatric:  Flat affect.  Vitals reviewed.   Lab Results  Component Value Date   WBC 9.2 04/17/2016   HGB 10.7 (L)  04/17/2016   HCT 33.0 (L) 04/17/2016   PLT 501.0 (H) 04/17/2016   GLUCOSE 443 (H) 04/14/2016   CHOL 138 04/14/2016   TRIG 183.0 (H) 04/14/2016   HDL 28.40 (L) 04/14/2016   LDLCALC 73 04/14/2016   ALT 12 04/14/2016   AST 10 04/14/2016   NA 130 (L) 04/14/2016   K 3.9 04/14/2016   CL 96 04/14/2016   CREATININE 0.55 04/14/2016   BUN 4 (L) 04/14/2016   CO2 27 04/14/2016   TSH 0.83 04/14/2016   PSA 0.39 04/14/2016   HGBA1C 15.2 (H) 04/14/2016    Assessment & Plan:   Problem List Items Addressed This Visit    DM (diabetes mellitus), type 2 (HCC) - Primary    Sugars very well controlled at this time. Decreasing insulin to 20 units daily. Follow-up in 2 months for A1c. His LDL is nearly at goal of less than 70 on no meds. Will consider statin future. Needs aspirin therapy secondary to tobacco abuse and DM 2. Will revisit at next visit. I am trying not to do so much at one time given the fact that it bothers him significantly due to his mental illness.      Relevant Medications   Insulin Glargine (BASAGLAR KWIKPEN) 100 UNIT/ML SOPN   CAP (community acquired pneumonia)    Doing  well. Reevaluating with chest x-ray today.      Relevant Orders   DG Chest 2 View   Anemia    Stable. Continue iron supplementation. Will recheck at next visit.         Meds ordered this encounter  Medications  . Insulin Pen Needle (B-D UF III MINI PEN NEEDLES) 31G X 5 MM MISC    Sig: Use as instructed to inject your insulin.    Dispense:  100 each    Refill:  4  . Insulin Glargine (BASAGLAR KWIKPEN) 100 UNIT/ML SOPN    Sig: 28 units daily.    Dispense:  5 pen    Refill:  3    Follow-up: 2 months; appt scheduled  Everlene Other DO [facility]

## 2016-05-08 NOTE — Assessment & Plan Note (Signed)
Doing well. Reevaluating with chest x-ray today.

## 2016-05-08 NOTE — Assessment & Plan Note (Signed)
Sugars very well controlled at this time. Decreasing insulin to 20 units daily. Follow-up in 2 months for A1c. His LDL is nearly at goal of less than 70 on no meds. Will consider statin future. Needs aspirin therapy secondary to tobacco abuse and DM 2. Will revisit at next visit. I am trying not to do so much at one time given the fact that it bothers him significantly due to his mental illness.

## 2016-05-08 NOTE — Assessment & Plan Note (Signed)
Stable. Continue iron supplementation. Will recheck at next visit.

## 2016-05-08 NOTE — Patient Instructions (Signed)
Decrease your insulin to 28 units daily.  Continue your other medications.  Follow up in 2 months.  Take care  Dr. Adriana Simasook

## 2016-05-15 ENCOUNTER — Ambulatory Visit (INDEPENDENT_AMBULATORY_CARE_PROVIDER_SITE_OTHER): Payer: PPO | Admitting: Pulmonary Disease

## 2016-05-15 ENCOUNTER — Encounter: Payer: Self-pay | Admitting: Pulmonary Disease

## 2016-05-15 VITALS — BP 112/72 | HR 113 | Wt 203.0 lb

## 2016-05-15 DIAGNOSIS — R911 Solitary pulmonary nodule: Secondary | ICD-10-CM

## 2016-05-15 DIAGNOSIS — F17200 Nicotine dependence, unspecified, uncomplicated: Secondary | ICD-10-CM

## 2016-05-15 DIAGNOSIS — J181 Lobar pneumonia, unspecified organism: Secondary | ICD-10-CM

## 2016-05-15 DIAGNOSIS — J189 Pneumonia, unspecified organism: Secondary | ICD-10-CM

## 2016-05-15 NOTE — Patient Instructions (Signed)
1) the pneumonia in your right lung looks improved.  2) we discussed the importance of quitting smoking. I suggested using nicotine replacement therapy.  3) to follow-up on the nodule seen in your left lung, we will repeat a CT scan of your chest around the end of April

## 2016-05-15 NOTE — Progress Notes (Signed)
PULMONARY OFFICE FOLLOW UP NOTE  Requesting MD/Service: Adriana Simasook Date of initial consultation: 04/24/16 Reason for consultation: RUL consolidation  PT PROFILE: [age] M smoker with schizophrenia (never institutionalized) referred for evaluation of RUL consolidation and concern raised for TB  Subjective:  No new complaints. He has recovered completely from his recent pneumonia. He continues to smoke 1 pack of cigarettes per day.Denies CP, fever, purulent sputum, hemoptysis, LE edema and calf tenderness   Objective:  Vitals:   05/15/16 1038  BP: 112/72  Pulse: (!) 113  SpO2: 97%  Weight: 203 lb (92.1 kg)  Room air   EXAM:   Gen: WDWN in NAD HEENT: All WNL Neck: NO LAN, no JVD noted Lungs: full BS, normal percussion note, no wheezes Cardiovascular: Reg rate, normal rhythm, no M noted Abdomen: Soft, NT +BS Ext: no C/C/E Neuro: CNs intact, motor/sens grossly intact Skin: No lesions noted   DATA:   BMP Latest Ref Rng & Units 04/14/2016  Glucose 70 - 99 mg/dL 161(W443(H)  BUN 6 - 23 mg/dL 4(L)  Creatinine 9.600.40 - 1.50 mg/dL 4.540.55  Sodium 098135 - 119145 mEq/L 130(L)  Potassium 3.5 - 5.1 mEq/L 3.9  Chloride 96 - 112 mEq/L 96  CO2 19 - 32 mEq/L 27  Calcium 8.4 - 10.5 mg/dL 9.1    CBC Latest Ref Rng & Units 04/17/2016 04/14/2016  WBC 4.0 - 10.5 K/uL 9.2 11.9(H)  Hemoglobin 13.0 - 17.0 g/dL 10.7(L) 10.9(L)  Hematocrit 39.0 - 52.0 % 33.0(L) 34.2(L)  Platelets 150.0 - 400.0 K/uL 501.0(H) 483.0(H)   Quantiferon gold 04/17/16: negative  CXR (05/08/2016):  Significant improvement in right upper lobe infiltrate with probable residual scarring  IMPRESSION:   1) recent right upper lobe community acquired pneumonia-clinically resolved, radiographically improved 2) incidental finding of 1 cm nodule in the inferior lingula by CT scan of the chest 04/15/2016 3) smoker with little insight into potential harmful aspects  PLAN:  1) we discussed the importance of quitting smoking. I suggested that he  use nicotine replacement therapy. 2) to follow-up on the lung nodule, a repeat CT scan of the chest will be obtained towards the end of April. 3) he will follow-up with me in the office after CT scan is obtained   Bobby Fischeravid Simonds, MD PCCM service Mobile [phone] Pager [phone] 05/15/2016

## 2016-06-08 ENCOUNTER — Telehealth: Payer: Self-pay | Admitting: Family Medicine

## 2016-06-08 DIAGNOSIS — F209 Schizophrenia, unspecified: Secondary | ICD-10-CM | POA: Diagnosis not present

## 2016-06-08 MED ORDER — BASAGLAR KWIKPEN 100 UNIT/ML ~~LOC~~ SOPN: PEN_INJECTOR | SUBCUTANEOUS | 3 refills | Status: DC

## 2016-06-08 NOTE — Telephone Encounter (Signed)
Pharmacy called requesting to refill Insulin Glargine (BASAGLAR KWIKPEN) 100 UNIT/ML SOPN. Pt has no insulin for today. Please advise, thank you!  Pharmacy - [facility] [place], [place] - [address]

## 2016-06-08 NOTE — Telephone Encounter (Signed)
Refill sent.

## 2016-06-09 DIAGNOSIS — Z79899 Other long term (current) drug therapy: Secondary | ICD-10-CM | POA: Diagnosis not present

## 2016-07-10 DIAGNOSIS — Z79899 Other long term (current) drug therapy: Secondary | ICD-10-CM | POA: Diagnosis not present

## 2016-07-13 ENCOUNTER — Ambulatory Visit (INDEPENDENT_AMBULATORY_CARE_PROVIDER_SITE_OTHER): Payer: PPO | Admitting: Family Medicine

## 2016-07-13 ENCOUNTER — Ambulatory Visit
Admission: RE | Admit: 2016-07-13 | Discharge: 2016-07-13 | Disposition: A | Discharge status: Home / Self Care | Payer: PPO | Source: Ambulatory Visit | Attending: Pulmonary Disease | Admitting: Pulmonary Disease

## 2016-07-13 ENCOUNTER — Encounter: Payer: Self-pay | Admitting: Family Medicine

## 2016-07-13 VITALS — BP 126/90 | HR 90 | Temp 98.6°F | Wt 197.5 lb

## 2016-07-13 DIAGNOSIS — F200 Paranoid schizophrenia: Secondary | ICD-10-CM | POA: Diagnosis not present

## 2016-07-13 DIAGNOSIS — J189 Pneumonia, unspecified organism: Secondary | ICD-10-CM | POA: Insufficient documentation

## 2016-07-13 DIAGNOSIS — Z794 Long term (current) use of insulin: Secondary | ICD-10-CM | POA: Diagnosis not present

## 2016-07-13 DIAGNOSIS — D649 Anemia, unspecified: Secondary | ICD-10-CM | POA: Diagnosis not present

## 2016-07-13 DIAGNOSIS — I2510 Atherosclerotic heart disease of native coronary artery without angina pectoris: Secondary | ICD-10-CM | POA: Diagnosis not present

## 2016-07-13 DIAGNOSIS — R918 Other nonspecific abnormal finding of lung field: Secondary | ICD-10-CM | POA: Diagnosis not present

## 2016-07-13 DIAGNOSIS — Z720 Tobacco use: Secondary | ICD-10-CM

## 2016-07-13 DIAGNOSIS — J479 Bronchiectasis, uncomplicated: Secondary | ICD-10-CM | POA: Insufficient documentation

## 2016-07-13 DIAGNOSIS — R911 Solitary pulmonary nodule: Secondary | ICD-10-CM | POA: Diagnosis not present

## 2016-07-13 DIAGNOSIS — J470 Bronchiectasis with acute lower respiratory infection: Secondary | ICD-10-CM | POA: Diagnosis not present

## 2016-07-13 DIAGNOSIS — E119 Type 2 diabetes mellitus without complications: Secondary | ICD-10-CM | POA: Diagnosis not present

## 2016-07-13 DIAGNOSIS — Z87891 Personal history of nicotine dependence: Secondary | ICD-10-CM | POA: Insufficient documentation

## 2016-07-13 MED ORDER — IOPAMIDOL (ISOVUE-300) INJECTION 61%
75.0000 mL | Freq: Once | INTRAVENOUS | Status: AC | PRN
  Administered 2016-07-13: 75 mL via INTRAVENOUS

## 2016-07-13 NOTE — Progress Notes (Signed)
Pre visit review using our clinic review tool, if applicable. No additional management support is needed unless otherwise documented below in the visit note. 

## 2016-07-13 NOTE — Assessment & Plan Note (Signed)
Patient requesting Rx for medication to quit. Very reluctant to start wellbutrin or chantix given psych history. Will need to discuss with psych. Can consider nicotine patch.

## 2016-07-13 NOTE — Assessment & Plan Note (Signed)
Stable on Clozapine.

## 2016-07-13 NOTE — Progress Notes (Signed)
Subjective:  Patient ID: Bobby Clements, male    DOB: 1970-12-05  Age: 46 y.o. MRN: [id number]  CC: Follow up  HPI:  46 year old male with schizophrenia, DM-2, tobacco abuse presents for follow up.  DM  Currently on 28 units of basaglar and metformin.  Sugars ranging from 71-160.  No reports of symptomatic low blood sugars.   Anemia  Normocytic.  Has been stable.  Currently on iron therapy (iron was low).  Needs labs today.  Schizophrenia  Remains stable on Clozapine.  Tobacco abuse  Smokes 1ppd.  Expresses interest in quitting.  Social Hx   Social History   Social History  . Marital status: Single    Spouse name: N/A  . Number of children: N/A  . Years of education: N/A   Social History Main Topics  . Smoking status: Current Every Day Smoker    Packs/day: 1.00  . Smokeless tobacco: Never Used  . Alcohol use No  . Drug use: No  . Sexual activity: No   Other Topics Concern  . None   Social History Narrative  . None    Review of Systems  Constitutional: Negative.   Respiratory: Negative.    Objective:  BP 126/90   Pulse 90   Temp 98.6 F (37 C) (Oral)   Wt 197 lb 8 oz (89.6 kg)   SpO2 96%   BMI 30.93 kg/m   BP/Weight 07/13/2016 05/15/2016 05/08/2016  Systolic BP 126 112 119  Diastolic BP 90 72 83  Wt. (Lbs) 197.5 203 207.4  BMI 30.93 31.79 32.48    Physical Exam  Constitutional: He is oriented to person, place, and time. He appears well-developed. No distress.  Cardiovascular: Normal rate and regular rhythm.   Pulmonary/Chest: Effort normal and breath sounds normal.  Neurological: He is alert and oriented to person, place, and time.  Psychiatric:  Anxious.  Vitals reviewed.   Lab Results  Component Value Date   WBC 9.2 04/17/2016   HGB 10.7 (L) 04/17/2016   HCT 33.0 (L) 04/17/2016   PLT 501.0 (H) 04/17/2016   GLUCOSE 443 (H) 04/14/2016   CHOL 138 04/14/2016   TRIG 183.0 (H) 04/14/2016   HDL 28.40 (L) 04/14/2016   LDLCALC 73 04/14/2016   ALT 12 04/14/2016   AST 10 04/14/2016   NA 130 (L) 04/14/2016   K 3.9 04/14/2016   CL 96 04/14/2016   CREATININE 0.55 04/14/2016   BUN 4 (L) 04/14/2016   CO2 27 04/14/2016   TSH 0.83 04/14/2016   PSA 0.39 04/14/2016   HGBA1C 15.2 (H) 04/14/2016    Assessment & Plan:   Problem List Items Addressed This Visit    Anemia    Rechecking today. Continue iron.      Relevant Orders   CBC   Iron, TIBC and Ferritin Panel   DM (diabetes mellitus), type 2 (HCC) - Primary    Sugars dramatically improved. A1C today. Foot exam and microalbumin today.  Discussed need for pneumococcal vaccine (I did not give as patient becomes very anxious and paranoid about new things; will plan to do this in the future). Continue Basaglar and metformin.       Relevant Orders   Hemoglobin A1c   Lipid panel   Microalbumin / creatinine urine ratio   Paranoid schizophrenia (HCC)    Stable on Clozapine.       Tobacco abuse    Patient requesting Rx for medication to quit. Very reluctant to start wellbutrin or chantix  given psych history. Will need to discuss with psych. Can consider nicotine patch.         Follow-up: 3 months  Kwana Ringel Adriana Simas DO [facility]

## 2016-07-13 NOTE — Assessment & Plan Note (Signed)
Sugars dramatically improved. A1C today. Foot exam and microalbumin today.  Discussed need for pneumococcal vaccine (I did not give as patient becomes very anxious and paranoid about new things; will plan to do this in the future). Continue Basaglar and metformin.

## 2016-07-13 NOTE — Patient Instructions (Addendum)
Continue your medications.   We will call with the lab results.  Follow up in 3 months.  Take care  Dr. Adriana Simas 63

## 2016-07-13 NOTE — Assessment & Plan Note (Signed)
Rechecking today. Continue iron.

## 2016-07-14 LAB — IRON,TIBC AND FERRITIN PANEL
%SAT: 16 % (ref 15–60)
FERRITIN: 45 ng/mL (ref 20–380)
Iron: 49 ug/dL — ABNORMAL LOW (ref 50–180)
TIBC: 308 ug/dL (ref 250–425)

## 2016-07-14 LAB — LIPID PANEL
CHOLESTEROL: 217 mg/dL — AB (ref 0–200)
HDL: 37.4 mg/dL — AB (ref >39.00)
NONHDL: 179.2
TRIGLYCERIDES: 236 mg/dL — AB (ref 0.0–149.0)
Total CHOL/HDL Ratio: 6
VLDL: 47.2 mg/dL — ABNORMAL HIGH (ref 0.0–40.0)

## 2016-07-14 LAB — CBC
HCT: 43.6 % (ref 39.0–52.0)
Hemoglobin: 14.4 g/dL (ref 13.0–17.0)
MCHC: 33.1 g/dL (ref 30.0–36.0)
MCV: 86.8 fl (ref 78.0–100.0)
PLATELETS: 270 10*3/uL (ref 150.0–400.0)
RBC: 5.02 Mil/uL (ref 4.22–5.81)
RDW: 18 % — ABNORMAL HIGH (ref 11.5–15.5)
WBC: 9.9 10*3/uL (ref 4.0–10.5)

## 2016-07-14 LAB — HEMOGLOBIN A1C: HEMOGLOBIN A1C: 6.4 % (ref 4.6–6.5)

## 2016-07-14 LAB — MICROALBUMIN / CREATININE URINE RATIO
Creatinine,U: 135.2 mg/dL
MICROALB/CREAT RATIO: 0.5 mg/g (ref 0.0–30.0)
Microalb, Ur: <0.7 mg/dL (ref 0.0–1.9)

## 2016-07-14 LAB — LDL CHOLESTEROL, DIRECT: LDL DIRECT: 147 mg/dL

## 2016-07-23 ENCOUNTER — Encounter: Payer: Self-pay | Admitting: Pulmonary Disease

## 2016-07-23 ENCOUNTER — Ambulatory Visit (INDEPENDENT_AMBULATORY_CARE_PROVIDER_SITE_OTHER): Payer: PPO | Admitting: Pulmonary Disease

## 2016-07-23 VITALS — BP 130/88 | HR 98 | Ht 67.0 in | Wt 198.0 lb

## 2016-07-23 DIAGNOSIS — Z8701 Personal history of pneumonia (recurrent): Secondary | ICD-10-CM

## 2016-07-23 DIAGNOSIS — F17200 Nicotine dependence, unspecified, uncomplicated: Secondary | ICD-10-CM

## 2016-07-23 DIAGNOSIS — R918 Other nonspecific abnormal finding of lung field: Secondary | ICD-10-CM

## 2016-07-23 NOTE — Progress Notes (Signed)
PULMONARY OFFICE FOLLOW UP NOTE  Requesting MD/Service: Bobby Clements Date of initial consultation: 04/24/16 Reason for consultation: RUL consolidation  PT PROFILE: [age] M smoker with schizophrenia (never institutionalized) referred for evaluation of RUL consolidation and concern raised for TB  Subjective:  He is here to review results of a repeat CT scan of his chest. He has no new complaints. He continues to smoke 1 pack of cigarettes per day. He wishes to start Chantix. Dr. Adriana Clements is communicating with his psychiatrist to determine whether this medication can be used safely.  Objective:  Vitals:   07/23/16 0847  BP: 130/88  Pulse: 98  SpO2: 98%  Weight: 198 lb (89.8 kg)  Height: 5\' 7"  (1.702 m)  Room air   EXAM:   Gen: WDWN in NAD HEENT: All WNL Neck: NO LAN, no JVD noted Lungs: Slightly coarse BS with no wheezes Cardiovascular: Reg rate, normal rhythm, no M noted Abdomen: Soft, NT +BS Ext: no C/C/E Neuro: CNs intact, motor/sens grossly intact Skin: No lesions noted   DATA:   BMP Latest Ref Rng & Units 04/14/2016  Glucose 70 - 99 mg/dL 063(K443(H)  BUN 6 - 23 mg/dL 4(L)  Creatinine 1.600.40 - 1.50 mg/dL 1.090.55  Sodium 323135 - 557145 mEq/L 130(L)  Potassium 3.5 - 5.1 mEq/L 3.9  Chloride 96 - 112 mEq/L 96  CO2 19 - 32 mEq/L 27  Calcium 8.4 - 10.5 mg/dL 9.1    CBC Latest Ref Rng & Units 07/13/2016 04/17/2016 04/14/2016  WBC 4.0 - 10.5 K/uL 9.9 9.2 11.9(H)  Hemoglobin 13.0 - 17.0 g/dL 32.214.4 10.7(L) 10.9(L)  Hematocrit 39.0 - 52.0 % 43.6 33.0(L) 34.2(L)  Platelets 150.0 - 400.0 K/uL 270.0 501.0(H) 483.0(H)   Quantiferon gold 04/17/16: negative  CT chest (07/13/16):  Dramatic improvement in right upper lobe consolidation compared to prior CT scan in January. There are residual changes of scarring and bronchiectasis in the right upper lobe.   IMPRESSION:   1) recent severe right upper lobe pneumonia - now fully treated and radiographically resolved with residual changes of scarring, right upper  lobe cysts and bronchiectasis. 2) smoker  PLAN:  I counseled him regarding then benefits of smoking cessation We discussed the possibility of Chantix and its effectiveness No further CT imaging of the chest is warranted Follow-up 3 months with a chest x-ray prior to that visit (to establish his new baseline chest x-ray)   Bobby Fischeravid Adah Stoneberg, MD PCCM service Mobile [phone] Pager [phone] 07/23/2016 3:25 PM

## 2016-07-28 ENCOUNTER — Telehealth: Payer: Self-pay | Admitting: *Deleted

## 2016-07-28 ENCOUNTER — Other Ambulatory Visit: Payer: Self-pay | Admitting: Family Medicine

## 2016-07-28 MED ORDER — INSULIN GLARGINE 100 UNIT/ML SOLOSTAR PEN: 28.0000 [IU] | PEN_INJECTOR | Freq: Every day | SUBCUTANEOUS | 11 refills | Status: DC

## 2016-07-28 NOTE — Telephone Encounter (Signed)
Called pharmacy and insurance will cover Lantus, Novolog, and levemir.

## 2016-07-28 NOTE — Telephone Encounter (Signed)
[facility] another Rx in the place of basaglar . Insurance will not cover this med.  [facility]  Contact [phone]

## 2016-07-28 NOTE — Telephone Encounter (Signed)
Alternative that insurance approves?

## 2016-07-28 NOTE — Telephone Encounter (Signed)
Recommendation on med change or do you want him to come get samples.

## 2016-07-28 NOTE — Telephone Encounter (Signed)
Rx sent 

## 2016-08-11 DIAGNOSIS — Z79899 Other long term (current) drug therapy: Secondary | ICD-10-CM | POA: Diagnosis not present

## 2016-09-11 DIAGNOSIS — Z79899 Other long term (current) drug therapy: Secondary | ICD-10-CM | POA: Diagnosis not present

## 2016-10-05 DIAGNOSIS — Z79899 Other long term (current) drug therapy: Secondary | ICD-10-CM | POA: Diagnosis not present

## 2016-10-05 DIAGNOSIS — F209 Schizophrenia, unspecified: Secondary | ICD-10-CM | POA: Diagnosis not present

## 2016-10-05 DIAGNOSIS — Z720 Tobacco use: Secondary | ICD-10-CM | POA: Diagnosis not present

## 2016-10-12 ENCOUNTER — Encounter: Payer: Self-pay | Admitting: Family Medicine

## 2016-10-12 ENCOUNTER — Ambulatory Visit (INDEPENDENT_AMBULATORY_CARE_PROVIDER_SITE_OTHER): Payer: PPO | Admitting: Family Medicine

## 2016-10-12 VITALS — BP 98/70 | HR 83 | Temp 98.4°F | Resp 16 | Wt 209.0 lb

## 2016-10-12 DIAGNOSIS — Z79899 Other long term (current) drug therapy: Secondary | ICD-10-CM | POA: Diagnosis not present

## 2016-10-12 DIAGNOSIS — D649 Anemia, unspecified: Secondary | ICD-10-CM | POA: Diagnosis not present

## 2016-10-12 DIAGNOSIS — L720 Epidermal cyst: Secondary | ICD-10-CM | POA: Diagnosis not present

## 2016-10-12 DIAGNOSIS — Z720 Tobacco use: Secondary | ICD-10-CM | POA: Diagnosis not present

## 2016-10-12 DIAGNOSIS — Z794 Long term (current) use of insulin: Secondary | ICD-10-CM

## 2016-10-12 DIAGNOSIS — E119 Type 2 diabetes mellitus without complications: Secondary | ICD-10-CM | POA: Diagnosis not present

## 2016-10-12 LAB — COMPREHENSIVE METABOLIC PANEL
ALT: 10 U/L (ref 0–53)
AST: 10 U/L (ref 0–37)
Albumin: 4.2 g/dL (ref 3.5–5.2)
Alkaline Phosphatase: 56 U/L (ref 39–117)
BUN: 12 mg/dL (ref 6–23)
CHLORIDE: 108 meq/L (ref 96–112)
CO2: 25 meq/L (ref 19–32)
Calcium: 9.5 mg/dL (ref 8.4–10.5)
Creatinine, Ser: 0.68 mg/dL (ref 0.40–1.50)
GFR: 133.2 mL/min (ref >60.00)
Glucose, Bld: 83 mg/dL (ref 70–99)
POTASSIUM: 4.4 meq/L (ref 3.5–5.1)
Sodium: 138 mEq/L (ref 135–145)
Total Bilirubin: 0.4 mg/dL (ref 0.2–1.2)
Total Protein: 6.9 g/dL (ref 6.0–8.3)

## 2016-10-12 LAB — CBC
HCT: 44.2 % (ref 39.0–52.0)
HEMOGLOBIN: 14.7 g/dL (ref 13.0–17.0)
MCHC: 33.2 g/dL (ref 30.0–36.0)
MCV: 92.9 fl (ref 78.0–100.0)
PLATELETS: 211 10*3/uL (ref 150.0–400.0)
RBC: 4.76 Mil/uL (ref 4.22–5.81)
RDW: 14.6 % (ref 11.5–15.5)
WBC: 7 10*3/uL (ref 4.0–10.5)

## 2016-10-12 LAB — HEMOGLOBIN A1C: Hgb A1c MFr Bld: 5.6 % (ref 4.6–6.5)

## 2016-10-12 MED ORDER — METFORMIN HCL 500 MG PO TABS: 500.0000 mg | ORAL_TABLET | Freq: Two times a day (BID) | ORAL | 3 refills | Status: DC

## 2016-10-12 MED ORDER — INSULIN GLARGINE 100 UNIT/ML SOLOSTAR PEN: 28.0000 [IU] | PEN_INJECTOR | Freq: Every day | SUBCUTANEOUS | 11 refills | Status: DC

## 2016-10-12 MED ORDER — BASAGLAR KWIKPEN 100 UNIT/ML ~~LOC~~ SOPN: 28.0000 [IU] | PEN_INJECTOR | Freq: Every day | SUBCUTANEOUS | 3 refills | Status: DC

## 2016-10-12 NOTE — Assessment & Plan Note (Signed)
New problem. Physical exam revealed a likely cyst. It has been bothering him and has been painful. As a result, he desired to have it removed. Removed without difficulty today. See procedure below.  Procedure: Cyst removal  Area prepped and draped in sterile fashion.   Local anesthesia with 1% lidocaine with epi   #11 scalpel was used to make a linear incision.  Blunt dissection and pressure removed cyst sac and contents without difficulty.  Wound was closed with 2simple interrupted sutures.  Patient tolerated the procedure well. No complications. Minimal blood loss.

## 2016-10-12 NOTE — Assessment & Plan Note (Signed)
At goal.  A1C today. Continue Lantus 28 units daily and Metformin.

## 2016-10-12 NOTE — Assessment & Plan Note (Signed)
Advised to quit. Difficult situation in setting of schizophrenia.

## 2016-10-12 NOTE — Patient Instructions (Signed)
Continue your medications.  Follow up in 3 months.  Take care  Dr. Adriana Simasook    Sutured Wound Care Sutures are stitches that can be used to close wounds. Taking care of your wound properly can help prevent pain and infection. It can also help your wound to heal more quickly. How is this treated? Wound Care  Keep the wound clean and dry.  If you were given a bandage (dressing), change it at least one time per day or as told by your doctor. You should also change it if it gets wet or dirty.  Keep the wound completely dry for the first 24 hours or as told by your doctor. After that time, you may shower or bathe. However, make sure that the wound is not soaked in water until the sutures have been removed.  Clean the wound one time each day or as told by your doctor. ? Wash the wound with soap and water. ? Rinse the wound with water to remove all soap. ? Pat the wound dry with a clean towel. Do not rub the wound.  After cleaning the wound, put a thin layer of antibiotic ointment on it as told your doctor. This ointment: ? Helps to prevent infection. ? Keeps the bandage from sticking to the wound.  Have the sutures removed as told by your doctor. General Instructions  Take or apply medicines only as told by your doctor.  To help prevent scarring, make sure to cover your wound with sunscreen whenever you are outside after the sutures are removed and the wound is healed. Make sure to wear a sunscreen of at least 30 SPF.  If you were prescribed an antibiotic medicine or ointment, finish all of it even if you start to feel better.  Do not scratch or pick at the wound.  Keep all follow-up visits as told by your doctor. This is important.  Check your wound every day for signs of infection. Watch for: ? Redness, swelling, or pain. ? Fluid, blood, or pus.  Raise (elevate) the injured area above the level of your heart while you are sitting or lying down, if possible.  Avoid stretching  your wound.  Drink enough fluids to keep your pee (urine) clear or pale yellow. Contact a doctor if:  You were given a tetanus shot and you have any of these where the needle went in: ? Swelling. ? Very bad pain. ? Redness. ? Bleeding.  You have a fever.  A wound that was closed breaks open.  You notice a bad smell coming from the wound.  You notice something coming out of the wound, such as wood or glass.  Medicine does not help your pain.  You have any of these at the site of the wound. ? More redness. ? More swelling. ? More pain.  You have any of these coming from the wound. ? Fluid. ? Blood. ? Pus.  You notice a change in the color of your skin near the wound.  You need to change the bandage often due to fluid, blood, or pus coming from the wound.  You have a new rash.  You have numbness around the wound. Get help right away if:  You have very bad swelling around the wound.  Your pain suddenly gets worse and is very bad.  You have painful lumps near the wound or on skin that is anywhere on your body.  You have a red streak going away from the wound.  The wound  is on your hand or foot and you cannot move a finger or toe like normal.  The wound is on your hand or foot and you notice that your fingers or toes look pale or bluish. This information is not intended to replace advice given to you by your health care provider. Make sure you discuss any questions you have with your health care provider. Document Released: 08/19/2007 Document Revised: 08/08/2015 Document Reviewed: 10/12/2012 Elsevier Interactive Patient Education  2017 [employer]

## 2016-10-12 NOTE — Progress Notes (Signed)
Subjective:  Patient ID: Bobby Clements, male    DOB: 08/06/1970  Age: 46 y.o. MRN: [id number]  CC: Follow up; Painful bump on chest wall  HPI:  46 year old male with paranoid schizophrenia, anemia, DM 2, tobacco abuse presents for follow-up. Additionally, he has another concern. See below.  DM 2  Blood sugars well controlled on metformin and Lantus 28 units daily. No hypoglycemia.  Anemia  Last hemoglobin and normal last.  We'll recheck today.  Tobacco abuse  Father states that Chantix was not recommended by psychiatrist.  Needs to cut back. I am concerned about using Chantix and/or Wellbutrin given his psychiatric history.  Painful bump  Patient reports that he has recently noticed a bump on his anterior chest wall.  Has recently become painful.  He has been "messing with it" per his father.  No drainage. No erythema. No fevers or chills.  He's been applying topical antibiotic ointment without improvement.  No other associated symptoms. No other complaints or concerns at this time.  Social Hx   Social History   Social History  . Marital status: Single    Spouse name: N/A  . Number of children: N/A  . Years of education: N/A   Social History Main Topics  . Smoking status: Current Every Day Smoker    Packs/day: 1.00  . Smokeless tobacco: Never Used  . Alcohol use No  . Drug use: No  . Sexual activity: No   Other Topics Concern  . None   Social History Narrative  . None    Review of Systems  Constitutional: Negative.   Endocrine: Negative.   Skin:       "Bump" - chest wall.    Objective:  BP 98/70 (BP Location: Left Arm, Patient Position: Sitting, Cuff Size: Large)   Pulse 83   Temp 98.4 F (36.9 C) (Oral)   Resp 16   Wt 209 lb (94.8 kg)   SpO2 95%   BMI 32.73 kg/m   BP/Weight 10/12/2016 07/23/2016 07/13/2016  Systolic BP 98 130 126  Diastolic BP 70 88 90  Wt. (Lbs) 209 198 197.5  BMI 32.73 31.01 30.93   Physical Exam    Constitutional: He appears well-developed. No distress.  Cardiovascular: Normal rate and regular rhythm.   Pulmonary/Chest: Effort normal. He has no wheezes. He has no rales.  Skin:     Chest wall, to the right of the sternum - firm nodule noted. No surrounding erythema.   Psychiatric:  Flat affect.  Anxious/paranoid.  Vitals reviewed.   Lab Results  Component Value Date   WBC 9.9 07/13/2016   HGB 14.4 07/13/2016   HCT 43.6 07/13/2016   PLT 270.0 07/13/2016   GLUCOSE 443 (H) 04/14/2016   CHOL 217 (H) 07/13/2016   TRIG 236.0 (H) 07/13/2016   HDL 37.40 (L) 07/13/2016   LDLDIRECT 147.0 07/13/2016   LDLCALC 73 04/14/2016   ALT 12 04/14/2016   AST 10 04/14/2016   NA 130 (L) 04/14/2016   K 3.9 04/14/2016   CL 96 04/14/2016   CREATININE 0.55 04/14/2016   BUN 4 (L) 04/14/2016   CO2 27 04/14/2016   TSH 0.83 04/14/2016   PSA 0.39 04/14/2016   HGBA1C 6.4 07/13/2016   MICROALBUR <0.7 07/13/2016    Assessment & Plan:   Problem List Items Addressed This Visit      Endocrine   DM (diabetes mellitus), type 2 (HCC) - Primary    At goal.  A1C today. Continue Lantus 28 units  daily and Metformin.      Relevant Medications   metFORMIN (GLUCOPHAGE) 500 MG tablet   Insulin Glargine (LANTUS SOLOSTAR) 100 UNIT/ML Solostar Pen   Other Relevant Orders   Comprehensive metabolic panel   Hemoglobin A1c     Other   Anemia    Rechecking today.      Relevant Orders   CBC   Epidermoid cyst    New problem. Physical exam revealed a likely cyst. It has been bothering him and has been painful. As a result, he desired to have it removed. Removed without difficulty today. See procedure below.  Procedure: Cyst removal  Area prepped and draped in sterile fashion.   Local anesthesia with 1% lidocaine with epi   #11 scalpel was used to make a linear incision.  Blunt dissection and pressure removed cyst sac and contents without difficulty.  Wound was closed with 2simple interrupted  sutures.  Patient tolerated the procedure well. No complications. Minimal blood loss.       Tobacco abuse    Advised to quit. Difficult situation in setting of schizophrenia.         Meds ordered this encounter  Medications  . DISCONTD: Insulin Glargine (BASAGLAR KWIKPEN) 100 UNIT/ML SOPN    Sig: Inject 0.28 mLs (28 Units total) into the skin daily.    Dispense:  15 mL    Refill:  3  . metFORMIN (GLUCOPHAGE) 500 MG tablet    Sig: Take 1 tablet (500 mg total) by mouth 2 (two) times daily with a meal.    Dispense:  180 tablet    Refill:  3  . Insulin Glargine (LANTUS SOLOSTAR) 100 UNIT/ML Solostar Pen    Sig: Inject 28 Units into the skin daily. Cancel Rx for [employer].    Dispense:  5 pen    Refill:  11    Follow-up: 3 months  Dontel Harshberger Adriana Simasook DO [facility] [place]

## 2016-10-12 NOTE — Assessment & Plan Note (Signed)
Rechecking today

## 2016-10-19 ENCOUNTER — Encounter: Payer: Self-pay | Admitting: Family Medicine

## 2016-10-19 ENCOUNTER — Ambulatory Visit (INDEPENDENT_AMBULATORY_CARE_PROVIDER_SITE_OTHER): Payer: PPO | Admitting: Family Medicine

## 2016-10-19 DIAGNOSIS — Z4802 Encounter for removal of sutures: Secondary | ICD-10-CM

## 2016-10-19 NOTE — Progress Notes (Signed)
   Subjective:  Patient ID: Bobby LaymanJason G Luckman, male    DOB: 11/02/1970  Age: 46 y.o. MRN: [id number]  CC: Suture removal  HPI:  [id number] are old male presents for suture removal.  Patient was recently seen and had a cyst removed from his anterior chest wall. 2 single interrupted sutures were placed. He presents today for removal. There is mild surrounding erythema. No reports of drainage from the wound. Incision is well approximated. He states that it feels much improved.  Social Hx   Social History   Social History  . Marital status: Single    Spouse name: N/A  . Number of children: N/A  . Years of education: N/A   Social History Main Topics  . Smoking status: Current Every Day Smoker    Packs/day: 1.00  . Smokeless tobacco: Never Used  . Alcohol use No  . Drug use: No  . Sexual activity: No   Other Topics Concern  . None   Social History Narrative  . None    Review of Systems  Constitutional: Negative.   Skin: Positive for wound.   Objective:  BP 122/70 (BP Location: Left Arm, Patient Position: Sitting, Cuff Size: Normal)   Pulse 95   Temp 98.4 F (36.9 C) (Oral)   Resp 16   Wt 207 lb 8 oz (94.1 kg)   SpO2 95%   BMI 32.50 kg/m   BP/Weight 10/19/2016 10/12/2016 07/23/2016  Systolic BP 122 98 130  Diastolic BP 70 70 88  Wt. (Lbs) 207.5 209 198  BMI 32.5 32.73 31.01   Physical Exam  Constitutional: He appears well-developed. No distress.  Skin:  Anterior chest - wound well healed. Surrounding erythema, mild.  Vitals reviewed.   Lab Results  Component Value Date   WBC 7.0 10/12/2016   HGB 14.7 10/12/2016   HCT 44.2 10/12/2016   PLT 211.0 10/12/2016   GLUCOSE 83 10/12/2016   CHOL 217 (H) 07/13/2016   TRIG 236.0 (H) 07/13/2016   HDL 37.40 (L) 07/13/2016   LDLDIRECT 147.0 07/13/2016   LDLCALC 73 04/14/2016   ALT 10 10/12/2016   AST 10 10/12/2016   NA 138 10/12/2016   K 4.4 10/12/2016   CL 108 10/12/2016   CREATININE 0.68 10/12/2016   BUN 12 10/12/2016   CO2 25 10/12/2016   TSH 0.83 04/14/2016   PSA 0.39 04/14/2016   HGBA1C 5.6 10/12/2016   MICROALBUR <0.7 07/13/2016    Assessment & Plan:   Problem List Items Addressed This Visit    Visit for suture removal    Sutures removed in standard fashion today.        Follow-up: As scheduled.   Everlene OtherJayce Blayke Pinera DO [facility] [place]

## 2016-10-19 NOTE — Assessment & Plan Note (Signed)
Sutures removed in standard fashion today.

## 2016-11-12 DIAGNOSIS — Z79899 Other long term (current) drug therapy: Secondary | ICD-10-CM | POA: Diagnosis not present

## 2016-12-14 DIAGNOSIS — Z79899 Other long term (current) drug therapy: Secondary | ICD-10-CM | POA: Diagnosis not present

## 2017-01-04 DIAGNOSIS — Z716 Tobacco abuse counseling: Secondary | ICD-10-CM | POA: Diagnosis not present

## 2017-01-04 DIAGNOSIS — F209 Schizophrenia, unspecified: Secondary | ICD-10-CM | POA: Diagnosis not present

## 2017-01-04 DIAGNOSIS — Z720 Tobacco use: Secondary | ICD-10-CM | POA: Diagnosis not present

## 2017-01-13 ENCOUNTER — Ambulatory Visit: Payer: PPO | Admitting: Family Medicine

## 2017-01-13 ENCOUNTER — Ambulatory Visit (INDEPENDENT_AMBULATORY_CARE_PROVIDER_SITE_OTHER): Payer: PPO | Admitting: Family Medicine

## 2017-01-13 ENCOUNTER — Encounter: Payer: Self-pay | Admitting: Family Medicine

## 2017-01-13 VITALS — BP 128/82 | HR 101 | Temp 98.2°F | Wt 219.8 lb

## 2017-01-13 DIAGNOSIS — Z720 Tobacco use: Secondary | ICD-10-CM

## 2017-01-13 DIAGNOSIS — Z23 Encounter for immunization: Secondary | ICD-10-CM

## 2017-01-13 DIAGNOSIS — Z794 Long term (current) use of insulin: Secondary | ICD-10-CM

## 2017-01-13 DIAGNOSIS — R229 Localized swelling, mass and lump, unspecified: Secondary | ICD-10-CM | POA: Diagnosis not present

## 2017-01-13 DIAGNOSIS — F200 Paranoid schizophrenia: Secondary | ICD-10-CM | POA: Diagnosis not present

## 2017-01-13 DIAGNOSIS — E119 Type 2 diabetes mellitus without complications: Secondary | ICD-10-CM | POA: Diagnosis not present

## 2017-01-13 LAB — POCT GLYCOSYLATED HEMOGLOBIN (HGB A1C): Hemoglobin A1C: 5.6

## 2017-01-13 NOTE — Patient Instructions (Addendum)
Nice to see you. Your diabetes is very well controlled. We'll get an A1c today and contact you with the results. We will get you to see an eye doctor as well.

## 2017-01-13 NOTE — Assessment & Plan Note (Signed)
Cyst versus lymph node. Likely slightly enlarged lymph node inferior to this. We'll refer to general surgery to consider removal for diagnostic purposes.

## 2017-01-13 NOTE — Progress Notes (Signed)
  Marikay AlarEric Scotty Weigelt, MD Phone: [phone]  Brayton LaymanJason G Basaldua is a 46 y.o. male who presents today for f/u.  DIABETES Disease Monitoring: Blood Sugar ranges-mostly 80s-120s, rare excursion to 180 Polyuria/phagia/dipsia- no      Visual problems- no Medications: Compliance- taking lantus 25 u, metformin Hypoglycemic symptoms- no  Schizophrenia: Taking clozapine and Prozac. Follows with behavioral health. Notes this is stable. Has CBC checked tomorrow.  Patient does note a bump on his left lateral posterior neck that has been there for some time now. He does have a history of epidermal cysts. It does not hurt.   PMH: Former smoker   ROS see history of present illness  Objective  Physical Exam Vitals:   01/13/17 0839  BP: 128/82  Pulse: (!) 101  Temp: 98.2 F (36.8 C)  SpO2: 99%    BP Readings from Last 3 Encounters:  01/13/17 128/82  10/19/16 122/70  10/12/16 98/70   Wt Readings from Last 3 Encounters:  01/13/17 219 lb 12.8 oz (99.7 kg)  10/19/16 207 lb 8 oz (94.1 kg)  10/12/16 209 lb (94.8 kg)    Physical Exam  Constitutional: No distress.  Neck:    Cardiovascular: Normal rate, regular rhythm and normal heart sounds.   Pulmonary/Chest: Effort normal and breath sounds normal.  Musculoskeletal: He exhibits no edema.  Neurological: He is alert. Gait normal.  Skin: Skin is warm and dry. He is not diaphoretic.     Assessment/Plan: Please see individual problem list.  DM (diabetes mellitus), type 2 (HCC) Much improved. Check A1c. Could consider decreasing Lantus further.  Paranoid schizophrenia (HCC) Stable clozapine.  Tobacco abuse Quit smoking 5 weeks ago.  Subcutaneous nodule Cyst versus lymph node. Likely slightly enlarged lymph node inferior to this. We'll refer to general surgery to consider removal for diagnostic purposes.   Orders Placed This Encounter  Procedures  . Pneumococcal polysaccharide vaccine 23-valent greater than or equal to 2yo  subcutaneous/IM  . Flu Vaccine QUAD 36+ mos IM  . Ambulatory referral to Ophthalmology    Referral Priority:   Routine    Referral Type:   Consultation    Referral Reason:   Specialty Services Required    Requested Specialty:   Ophthalmology    Number of Visits Requested:   1  . Ambulatory referral to General Surgery    Referral Priority:   Routine    Referral Type:   Surgical    Referral Reason:   Specialty Services Required    Requested Specialty:   General Surgery    Number of Visits Requested:   1  . POCT HgB A1C    Marikay AlarEric Shantinique Picazo, MD [facility]

## 2017-01-13 NOTE — Assessment & Plan Note (Signed)
Quit smoking 5 weeks ago.

## 2017-01-13 NOTE — Assessment & Plan Note (Addendum)
Stable clozapine.

## 2017-01-13 NOTE — Assessment & Plan Note (Signed)
Much improved. Check A1c. Could consider decreasing Lantus further.

## 2017-01-14 DIAGNOSIS — Z79899 Other long term (current) drug therapy: Secondary | ICD-10-CM | POA: Diagnosis not present

## 2017-01-18 ENCOUNTER — Encounter: Payer: Self-pay | Admitting: Family Medicine

## 2017-01-18 ENCOUNTER — Encounter: Payer: Self-pay | Admitting: General Surgery

## 2017-01-18 ENCOUNTER — Ambulatory Visit (INDEPENDENT_AMBULATORY_CARE_PROVIDER_SITE_OTHER): Payer: PPO | Admitting: General Surgery

## 2017-01-18 VITALS — BP 128/78 | HR 94 | Resp 14 | Ht 66.0 in | Wt 217.0 lb

## 2017-01-18 DIAGNOSIS — L723 Sebaceous cyst: Secondary | ICD-10-CM

## 2017-01-18 MED ORDER — DOXYCYCLINE HYCLATE 100 MG PO CAPS: 100.0000 mg | ORAL_CAPSULE | Freq: Two times a day (BID) | ORAL | 0 refills | Status: DC

## 2017-01-18 NOTE — Patient Instructions (Addendum)
Will need to treat the infection first before this area can be removed.  Finish your antibiotics you have been prescribed today.  Report any redness, fevers, or bad pain in the area.  Will come back here for removal in two weeks.

## 2017-01-18 NOTE — Progress Notes (Signed)
Patient ID: Bobby Clements, male   DOB: [birth year], 46 y.o.   MRN: [id number]  Chief Complaint  Patient presents with  . Other    neck mass    HPI Bobby Clements is a 46 y.o. male here for evaluation of a left neck mass. States he has had this for a few years. He reports mild tenderness in the area. Denies bleeding or drainage. He recently quit smoking, is an insulin dependent diabetic. HPI  Past Medical History:  Diagnosis Date  . Paranoid schizophrenia [facility])     Past Surgical History:  Procedure Laterality Date  . NO PAST SURGERIES      Family History  Adopted: Yes    Social History Social History   Tobacco Use  . Smoking status: Former Smoker    Packs/day: 1.00  . Smokeless tobacco: Former Neurosurgeon    Quit date: 12/14/2016  Substance Use Topics  . Alcohol use: No  . Drug use: No    No Known Allergies  Current Outpatient Medications  Medication Sig Dispense Refill  . cloZAPine (CLOZARIL) 100 MG tablet Take 100 mg by mouth 2 (two) times daily. Take one tablet in the morning and two tablets at night    . FLUoxetine (PROZAC) 40 MG capsule Take 40 mg by mouth at bedtime.     . Insulin Glargine (LANTUS SOLOSTAR) 100 UNIT/ML Solostar Pen Inject 28 Units into the skin daily. Cancel Rx for [employer]. (Patient taking differently: Inject 22 Units daily into the skin. Cancel Rx for [employer].) 5 pen 11  . Insulin Pen Needle (B-D UF III MINI PEN NEEDLES) 31G X 5 MM MISC Use as instructed to inject your insulin. 100 each 4  . metFORMIN (GLUCOPHAGE) 500 MG tablet Take 1 tablet (500 mg total) by mouth 2 (two) times daily with a meal. 180 tablet 3  . doxycycline (VIBRAMYCIN) 100 MG capsule Take 1 capsule (100 mg total) 2 (two) times daily by mouth. 14 capsule 0   No current facility-administered medications for this visit.     Review of Systems Review of Systems  Constitutional: Negative.   Respiratory: Negative.   Cardiovascular: Negative.     Blood pressure 128/78, pulse 94,  resp. rate 14, height 5\' 6"  (1.676 m), weight 217 lb (98.4 kg).  Physical Exam Physical Exam  Constitutional: He is oriented to person, place, and time. He appears well-developed and well-nourished.  Eyes: Conjunctivae are normal. No scleral icterus.  Neck: Neck supple.    Cardiovascular: Normal rate, regular rhythm and normal heart sounds.  Pulmonary/Chest: Effort normal and breath sounds normal.  Lymphadenopathy:    He has no cervical adenopathy.    He has no axillary adenopathy.  Neurological: He is alert and oriented to person, place, and time.  Skin: Skin is warm and dry.  Psychiatric: He has a normal mood and affect.    Data Reviewed None  Assessment    Mildly inflamed 1.5cm sebaceous cyst of left neck. No abscess or drainage. Insulin dependent diabetic, no signs of systemic infection. Will treat with empiric antibiotics before attempting excision.     Plan    Empiric antibiotics, doxycycline,  for one week. Return for in office excision in 2 weeks.  Instructed pt to call if cyst became more painful or if he develops systemic infectious symptoms.     HPI, Physical Exam, Assessment and Plan have been scribed under the direction and in the presence of Kathreen Cosier, MD  [name], LPN  I  have completed the exam and reviewed the above documentation for accuracy and completeness.  I agree with the above.  [employer]gallery [employer] has been used and any errors in dictation or transcription are unintentional.  Lua Feng G. Evette CristalSankar, M.D., F.A.C.S.  Gerlene BurdockSANKAR,Abagael Kramm G 01/18/2017, 12:37 PM

## 2017-02-01 ENCOUNTER — Ambulatory Visit (INDEPENDENT_AMBULATORY_CARE_PROVIDER_SITE_OTHER): Payer: PPO | Admitting: General Surgery

## 2017-02-01 VITALS — BP 132/78 | HR 96 | Resp 12 | Ht 66.0 in | Wt 222.0 lb

## 2017-02-01 DIAGNOSIS — L720 Epidermal cyst: Secondary | ICD-10-CM | POA: Diagnosis not present

## 2017-02-01 DIAGNOSIS — L723 Sebaceous cyst: Secondary | ICD-10-CM | POA: Diagnosis not present

## 2017-02-01 NOTE — Progress Notes (Signed)
Patient ID: Bobby Clements, male   DOB: 02/02/1971, 46 y.o.   MRN: [id number]  Chief Complaint  Patient presents with  . Follow-up    HPI Bobby Clements is a 46 y.o. male here today for left neck sebaceous cyst excision. Cyst mildly inflamed on presentation, pt completed one week doxycycline.  HPI  Past Medical History:  Diagnosis Date  . Paranoid schizophrenia [facility](HCC)     Past Surgical History:  Procedure Laterality Date  . NO PAST SURGERIES      Family History  Adopted: Yes    Social History Social History   Tobacco Use  . Smoking status: Former Smoker    Packs/day: 1.00  . Smokeless tobacco: Former NeurosurgeonUser    Quit date: 12/14/2016  Substance Use Topics  . Alcohol use: No  . Drug use: No    No Known Allergies  Current Outpatient Medications  Medication Sig Dispense Refill  . cloZAPine (CLOZARIL) 100 MG tablet Take 100 mg by mouth 2 (two) times daily. Take one tablet in the morning and two tablets at night    . doxycycline (VIBRAMYCIN) 100 MG capsule Take 1 capsule (100 mg total) 2 (two) times daily by mouth. 14 capsule 0  . FLUoxetine (PROZAC) 40 MG capsule Take 40 mg by mouth at bedtime.     . Insulin Glargine (LANTUS SOLOSTAR) 100 UNIT/ML Solostar Pen Inject 28 Units into the skin daily. Cancel Rx for [employer]. (Patient taking differently: Inject 22 Units daily into the skin. Cancel Rx for [employer].) 5 pen 11  . Insulin Pen Needle (B-D UF III MINI PEN NEEDLES) 31G X 5 MM MISC Use as instructed to inject your insulin. 100 each 4  . metFORMIN (GLUCOPHAGE) 500 MG tablet Take 1 tablet (500 mg total) by mouth 2 (two) times daily with a meal. 180 tablet 3   No current facility-administered medications for this visit.     Review of Systems Review of Systems  Constitutional: Negative.   Cardiovascular: Negative.     Blood pressure 132/78, pulse 96, resp. rate 12, height 5\' 6"  (1.676 m), weight 222 lb (100.7 kg).  Physical Exam Physical Exam  Constitutional: He is  oriented to person, place, and time. He appears well-developed and well-nourished.  HENT:  Head: Normocephalic and atraumatic.  Neck:    Pulmonary/Chest: Effort normal.  Neurological: He is alert and oriented to person, place, and time.  Skin: Skin is warm and dry.    Data Reviewed Previous notes  Assessment   1.5 cm left neck sebaceous cyst, previously treated with one week doxycycline, no signs of infection. Pt consented to in office excision.   Procedure: Left neck mass excision.    The patient was placed in the right lateral decubitus position. A 10 mL mixture of 0.5 bupivacaine and 1% lidocaine was infiltrated circumferentially around the left neck mass. An elliptical incision was made around the mass and extended through the subcutaneous tissue. Electric cautery was used for hemostasis.The cyst was fully excised and sent to pathology. The skin was re-approximated using a midline vertical mattress suture with 4-0 prolene and two simple interrupted sutures. The excision site was dressed using Telfa and Tegaderm. Pt tolerated procedure well. An ice pack was applied to the area. Pt was educated on wound maintenance, and should return in 10 days for suture removal.     Plan    Patient to return in ten days for nurse visit for suture removal.      HPI, Physical Exam,  Assessment and Plan have been scribed under the direction and in the presence of Kathreen CosierS. G. Maggie Senseney, MD  Ples SpecterJessica Qualls, CMA  I have completed the exam and reviewed the above documentation for accuracy and completeness.  I agree with the above.  Museum/gallery conserv[employer] has been used and any errors in dictation or transcription are unintentional.  Jacksyn Beeks G. Evette CristalSankar, M.D., F.A.C.S.  Gerlene BurdockSANKAR,Khamya Topp G 02/08/2017, 2:18 PM

## 2017-02-01 NOTE — Patient Instructions (Addendum)
Return in ten days for nurse vist.

## 2017-02-11 ENCOUNTER — Ambulatory Visit (INDEPENDENT_AMBULATORY_CARE_PROVIDER_SITE_OTHER): Payer: PPO | Admitting: *Deleted

## 2017-02-11 DIAGNOSIS — L723 Sebaceous cyst: Secondary | ICD-10-CM

## 2017-02-11 NOTE — Progress Notes (Signed)
The sutures were removed and steri strips applied. 

## 2017-02-11 NOTE — Patient Instructions (Signed)
Return as needed

## 2017-02-15 DIAGNOSIS — Z79899 Other long term (current) drug therapy: Secondary | ICD-10-CM | POA: Diagnosis not present

## 2017-02-25 DIAGNOSIS — E119 Type 2 diabetes mellitus without complications: Secondary | ICD-10-CM | POA: Diagnosis not present

## 2017-02-25 LAB — HM DIABETES EYE EXAM

## 2017-02-26 ENCOUNTER — Encounter: Payer: Self-pay | Admitting: Family Medicine

## 2017-02-26 ENCOUNTER — Ambulatory Visit (INDEPENDENT_AMBULATORY_CARE_PROVIDER_SITE_OTHER): Payer: PPO | Admitting: Pulmonary Disease

## 2017-02-26 ENCOUNTER — Ambulatory Visit
Admission: RE | Admit: 2017-02-26 | Discharge: 2017-02-26 | Disposition: A | Discharge status: Home / Self Care | Payer: PPO | Source: Ambulatory Visit | Attending: Pulmonary Disease | Admitting: Pulmonary Disease

## 2017-02-26 ENCOUNTER — Encounter: Payer: Self-pay | Admitting: Pulmonary Disease

## 2017-02-26 VITALS — Ht 66.0 in

## 2017-02-26 DIAGNOSIS — R918 Other nonspecific abnormal finding of lung field: Secondary | ICD-10-CM

## 2017-02-26 DIAGNOSIS — Z87891 Personal history of nicotine dependence: Secondary | ICD-10-CM | POA: Diagnosis not present

## 2017-02-26 DIAGNOSIS — Z8701 Personal history of pneumonia (recurrent): Secondary | ICD-10-CM | POA: Diagnosis not present

## 2017-02-26 DIAGNOSIS — J189 Pneumonia, unspecified organism: Secondary | ICD-10-CM | POA: Diagnosis not present

## 2017-02-26 NOTE — Patient Instructions (Signed)
Chest x-ray ordered for today We will contact you with results Congratulations on smoking cessation Follow-up as needed

## 2017-03-01 ENCOUNTER — Telehealth: Payer: Self-pay | Admitting: *Deleted

## 2017-03-01 NOTE — Telephone Encounter (Signed)
-----   Message from Merwyn Katosavid B Simonds, MD sent at 03/01/2017  2:34 PM EST ----- Let him know that CXR shows further improvement and no new findings of concern  Theodoro Gristave

## 2017-03-01 NOTE — Telephone Encounter (Signed)
LMOM for pt to return call for results. 

## 2017-03-01 NOTE — Progress Notes (Signed)
PULMONARY OFFICE FOLLOW UP NOTE  Requesting MD/Service: Adriana Simasook Date of initial consultation: 04/24/16 Reason for consultation: RUL consolidation  PT PROFILE: [age] M smoker with schizophrenia (never institutionalized) referred for evaluation of RUL consolidation and concern raised for TB  DATA: CT chest 04/15/16: Dense right upper lobe airspace opacification, with more patchy airspace opacity at the right middle and lower lobes, and mild underlying right upper lobe bronchiectasis. This is concerning for significant pneumonia. Lingular nodule noted  CT chest (07/13/16):  Dramatic improvement in right upper lobe consolidation compared to prior CT scan in January. There are residual changes of scarring and bronchiectasis in the right upper lobe. Lingular nodule resolved  INTERVAL: No major events  Subjective:  This is a routine reevaluation.  He quit smoking approximately 11 weeks ago.  He has no new complaints. Denies CP, fever, purulent sputum, hemoptysis, LE edema and calf tenderness   Objective: Vitals:   02/26/17 1006  Height: 5\' 6"  (1.676 m)    EXAM:   Gen: NAD HEENT: NCAT, sclerae white Neck: No JVD noted Lungs: Clear without adventitious sounds Cardiovascular: RRR, no M noted Abdomen: Soft, NT +BS Ext: no C/C/E Neuro: grossly intact   DATA:   BMP Latest Ref Rng & Units 10/12/2016 04/14/2016  Glucose 70 - 99 mg/dL 83 960(A443(H)  BUN 6 - 23 mg/dL 12 4(L)  Creatinine 5.400.40 - 1.50 mg/dL 9.810.68 1.910.55  Sodium 478135 - 145 mEq/L 138 130(L)  Potassium 3.5 - 5.1 mEq/L 4.4 3.9  Chloride 96 - 112 mEq/L 108 96  CO2 19 - 32 mEq/L 25 27  Calcium 8.4 - 10.5 mg/dL 9.5 9.1    CBC Latest Ref Rng & Units 10/12/2016 07/13/2016 04/17/2016  WBC 4.0 - 10.5 K/uL 7.0 9.9 9.2  Hemoglobin 13.0 - 17.0 g/dL 29.514.7 62.114.4 10.7(L)  Hematocrit 39.0 - 52.0 % 44.2 43.6 33.0(L)  Platelets 150.0 - 400.0 K/uL 211.0 270.0 501.0(H)      IMPRESSION:   1) recent severe right upper lobe pneumonia - essentially resolved  with residual RUL scarring 2) Former smoker  PLAN:  Chest x-ray ordered for today We will contact you with results Congratulated him on smoking cessation and counseled re: need to remain abstinent Follow-up as needed   Billy Fischeravid Simonds, MD PCCM service Mobile [phone] Pager [phone] 03/01/2017 2:29 PM  ADD: CXR reveals further improvement in RUL opacity and no new worrisome findings  Billy Fischeravid Simonds, MD PCCM service Mobile [phone] Pager [phone] 03/01/2017 2:36 PM

## 2017-03-02 NOTE — Telephone Encounter (Signed)
Pt mother returning our call  °Please call back  ° °

## 2017-03-03 NOTE — Telephone Encounter (Signed)
Patient's mother aware of results nothing further needed.

## 2017-03-17 DIAGNOSIS — Z79899 Other long term (current) drug therapy: Secondary | ICD-10-CM | POA: Diagnosis not present

## 2017-03-29 ENCOUNTER — Other Ambulatory Visit: Payer: Self-pay | Admitting: Family Medicine

## 2017-04-21 DIAGNOSIS — Z79899 Other long term (current) drug therapy: Secondary | ICD-10-CM | POA: Diagnosis not present

## 2017-05-12 ENCOUNTER — Other Ambulatory Visit: Payer: Self-pay | Admitting: Family Medicine

## 2017-05-17 DIAGNOSIS — Z79899 Other long term (current) drug therapy: Secondary | ICD-10-CM | POA: Diagnosis not present

## 2017-05-24 DIAGNOSIS — F209 Schizophrenia, unspecified: Secondary | ICD-10-CM | POA: Diagnosis not present

## 2017-06-17 DIAGNOSIS — Z79899 Other long term (current) drug therapy: Secondary | ICD-10-CM | POA: Diagnosis not present

## 2017-07-13 ENCOUNTER — Ambulatory Visit (INDEPENDENT_AMBULATORY_CARE_PROVIDER_SITE_OTHER): Payer: PPO | Admitting: Family Medicine

## 2017-07-13 ENCOUNTER — Encounter: Payer: Self-pay | Admitting: Family Medicine

## 2017-07-13 ENCOUNTER — Other Ambulatory Visit: Payer: Self-pay

## 2017-07-13 ENCOUNTER — Other Ambulatory Visit: Payer: Self-pay | Admitting: Family Medicine

## 2017-07-13 VITALS — BP 114/80 | HR 95 | Temp 98.2°F | Ht 66.0 in | Wt 235.0 lb

## 2017-07-13 DIAGNOSIS — Z87891 Personal history of nicotine dependence: Secondary | ICD-10-CM | POA: Diagnosis not present

## 2017-07-13 DIAGNOSIS — R110 Nausea: Secondary | ICD-10-CM | POA: Diagnosis not present

## 2017-07-13 DIAGNOSIS — E119 Type 2 diabetes mellitus without complications: Secondary | ICD-10-CM | POA: Diagnosis not present

## 2017-07-13 DIAGNOSIS — E785 Hyperlipidemia, unspecified: Secondary | ICD-10-CM

## 2017-07-13 DIAGNOSIS — F200 Paranoid schizophrenia: Secondary | ICD-10-CM

## 2017-07-13 DIAGNOSIS — Z794 Long term (current) use of insulin: Secondary | ICD-10-CM | POA: Diagnosis not present

## 2017-07-13 DIAGNOSIS — R229 Localized swelling, mass and lump, unspecified: Secondary | ICD-10-CM | POA: Diagnosis not present

## 2017-07-13 LAB — CBC WITH DIFFERENTIAL/PLATELET
BASOS ABS: 0.1 10*3/uL (ref 0.0–0.1)
Basophils Relative: 0.9 % (ref 0.0–3.0)
EOS ABS: 0.1 10*3/uL (ref 0.0–0.7)
Eosinophils Relative: 2.2 % (ref 0.0–5.0)
HEMATOCRIT: 42.5 % (ref 39.0–52.0)
HEMOGLOBIN: 14.5 g/dL (ref 13.0–17.0)
LYMPHS PCT: 25.6 % (ref 12.0–46.0)
Lymphs Abs: 1.8 10*3/uL (ref 0.7–4.0)
MCHC: 34 g/dL (ref 30.0–36.0)
MCV: 89.2 fl (ref 78.0–100.0)
Monocytes Absolute: 0.4 10*3/uL (ref 0.1–1.0)
Monocytes Relative: 6.2 % (ref 3.0–12.0)
Neutro Abs: 4.5 10*3/uL (ref 1.4–7.7)
Neutrophils Relative %: 65.1 % (ref 43.0–77.0)
PLATELETS: 218 10*3/uL (ref 150.0–400.0)
RBC: 4.76 Mil/uL (ref 4.22–5.81)
RDW: 14 % (ref 11.5–15.5)
WBC: 6.9 10*3/uL (ref 4.0–10.5)

## 2017-07-13 LAB — COMPREHENSIVE METABOLIC PANEL
ALBUMIN: 4.3 g/dL (ref 3.5–5.2)
ALK PHOS: 57 U/L (ref 39–117)
ALT: 13 U/L (ref 0–53)
AST: 14 U/L (ref 0–37)
BILIRUBIN TOTAL: 0.5 mg/dL (ref 0.2–1.2)
BUN: 12 mg/dL (ref 6–23)
CALCIUM: 9.4 mg/dL (ref 8.4–10.5)
CO2: 26 mEq/L (ref 19–32)
CREATININE: 0.69 mg/dL (ref 0.40–1.50)
Chloride: 106 mEq/L (ref 96–112)
GFR: 130.55 mL/min (ref >60.00)
Glucose, Bld: 98 mg/dL (ref 70–99)
Potassium: 4.1 mEq/L (ref 3.5–5.1)
Sodium: 142 mEq/L (ref 135–145)
Total Protein: 6.6 g/dL (ref 6.0–8.3)

## 2017-07-13 LAB — MICROALBUMIN / CREATININE URINE RATIO
CREATININE, U: 99.8 mg/dL
Microalb Creat Ratio: 0.7 mg/g (ref 0.0–30.0)

## 2017-07-13 LAB — HEMOGLOBIN A1C: Hgb A1c MFr Bld: 6 % (ref 4.6–6.5)

## 2017-07-13 LAB — LDL CHOLESTEROL, DIRECT: Direct LDL: 143 mg/dL

## 2017-07-13 MED ORDER — ROSUVASTATIN CALCIUM 20 MG PO TABS: 20.0000 mg | ORAL_TABLET | Freq: Every day | ORAL | 3 refills | Status: DC

## 2017-07-13 NOTE — Assessment & Plan Note (Signed)
Nonspecific nausea.  Potentially could be because he has not had a bowel movement today.  Could be related to nervousness.  No other specific symptoms.  He will monitor.  If not improving he will let us know.  Given return precautions.

## 2017-07-13 NOTE — Assessment & Plan Note (Signed)
Saw general surgery.  Procedure performed.  Area has healed well.

## 2017-07-13 NOTE — Progress Notes (Signed)
The 10-year ASCVD risk score Denman George DC Montez Hageman., et al., 2013) is: 6.3%   Values used to calculate the score:     Age: 47 years     Sex: Male     Is Non-Hispanic African American: No     Diabetic: Yes     Tobacco smoker: No     Systolic Blood Pressure: 114 mmHg     Is BP treated: No     HDL Cholesterol: 37.4 mg/dL     Total Cholesterol: 217 mg/dL

## 2017-07-13 NOTE — Assessment & Plan Note (Addendum)
Seems to be very well controlled.  We will check an A1c.  He will continue his current regimen.

## 2017-07-13 NOTE — Assessment & Plan Note (Addendum)
Stable on his medications.  He will continue to follow with behavioral health.

## 2017-07-13 NOTE — Patient Instructions (Signed)
Nice to see you. Please make sure you at least get a snack after you take your insulin. Please monitor your nausea and if it is not improving let us know.  If you develop abdominal pain, vomiting, diarrhea, constipation, fevers, or any new or changing symptoms please seek medical attention immediately.

## 2017-07-13 NOTE — Progress Notes (Signed)
Tommi Rumps, MD Phone: [phone]  Bobby Clements is a 47 y.o. male who presents today for f/u.  He presents with his father who provides some of the history.  DIABETES Disease Monitoring: Blood Sugar ranges-<153 Polyuria/phagia/dipsia- no      Optho- UTD Medications: Compliance- taking lantus 22 u daily, metformin 500 mg BID Hypoglycemic symptoms- no  He saw general surgery and had the lesion over his left neck removed.  It was found to be an epidermoid cyst.  He has not had any additional issues.  He followed up with pulmonology regarding his prior abnormal chest x-ray.  It appears this was improved and no follow-up was recommended.  Continues to see behavioral health for his schizophrenia.  He notes no depression or anxiety.  No auditory or visual hallucinations.  He continues on Prozac and clozapine.  He reports some nausea this morning.  He notes really no other symptoms with it.  No vomiting or diarrhea.  His father thinks he may be nervous though the patient declines this.  No abdominal pain..  No fevers.  He has not had a bowel movement yet today.  No straining.  Typically has bowel movement daily that is normal.   Social History   Tobacco Use  Smoking Status Former Smoker  . Packs/day: 1.00  Smokeless Tobacco Former Systems developer  . Quit date: 12/14/2016     ROS see history of present illness  Objective  Physical Exam Vitals:   07/13/17 0846  BP: 114/80  Pulse: 95  Temp: 98.2 F (36.8 C)  SpO2: 98%    BP Readings from Last 3 Encounters:  07/13/17 114/80  02/01/17 132/78  01/18/17 128/78   Wt Readings from Last 3 Encounters:  07/13/17 235 lb (106.6 kg)  02/01/17 222 lb (100.7 kg)  01/18/17 217 lb (98.4 kg)    Physical Exam  Constitutional: No distress.  Cardiovascular: Normal rate, regular rhythm and normal heart sounds.  Pulmonary/Chest: Effort normal and breath sounds normal.  Abdominal: Soft. Bowel sounds are normal. He exhibits no distension.  There is no rebound and no guarding.  Minimal discomfort on palpation of his abdomen throughout  Musculoskeletal: He exhibits no edema.  Neurological: He is alert.  Skin: Skin is warm and dry. He is not diaphoretic.  Well-healed scar left neck  Diabetic Foot Exam - Simple   Simple Foot Form Diabetic Foot exam was performed with the following findings:  Yes 07/13/2017  9:15 AM  Visual Inspection No deformities, no ulcerations, no other skin breakdown bilaterally:  Yes Sensation Testing Intact to touch and monofilament testing bilaterally:  Yes Pulse Check Posterior Tibialis and Dorsalis pulse intact bilaterally:  Yes Comments     Assessment/Plan: Please see individual problem list.  DM (diabetes mellitus), type 2 ([place]) Seems to be very well controlled.  We will check an A1c.  He will continue his current regimen.  Paranoid schizophrenia ([place]) Stable on his medications.  He will continue to follow with behavioral health.  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  Subcutaneous nodule Saw general surgery.  Procedure performed.  Area has healed well.  Nausea Nonspecific nausea.  Potentially could be because he has not had a bowel movement today.  Could be related to nervousness.  No other specific symptoms.  He will monitor.  If not improving he will let us know.  Given return precautions.   Orders Placed This Encounter  Procedures  . HgB A1c  . Comp Met (CMET)  . CBC w/Diff  .  Direct LDL  . Urine Microalbumin w/creat. ratio    No orders of the defined types were placed in this encounter.    Tommi Rumps, MD [place]

## 2017-07-13 NOTE — Assessment & Plan Note (Signed)
Congratulated on smoking cessation.  

## 2017-07-19 DIAGNOSIS — Z79899 Other long term (current) drug therapy: Secondary | ICD-10-CM | POA: Diagnosis not present

## 2017-08-10 ENCOUNTER — Other Ambulatory Visit: Payer: Self-pay | Admitting: Family Medicine

## 2017-08-10 NOTE — Telephone Encounter (Signed)
This was last filled by Dr. Adriana Simas on 10-12-16, patient last saw you on 07-13-17 and is scheduled to see you on 01-12-18.Would you like to refill?

## 2017-08-13 ENCOUNTER — Other Ambulatory Visit (INDEPENDENT_AMBULATORY_CARE_PROVIDER_SITE_OTHER): Payer: PPO

## 2017-08-13 DIAGNOSIS — E785 Hyperlipidemia, unspecified: Secondary | ICD-10-CM

## 2017-08-13 LAB — LDL CHOLESTEROL, DIRECT: LDL DIRECT: 68 mg/dL

## 2017-08-13 LAB — HEPATIC FUNCTION PANEL
ALBUMIN: 4.4 g/dL (ref 3.5–5.2)
ALT: 14 U/L (ref 0–53)
AST: 13 U/L (ref 0–37)
Alkaline Phosphatase: 56 U/L (ref 39–117)
BILIRUBIN DIRECT: 0.1 mg/dL (ref 0.0–0.3)
TOTAL PROTEIN: 6.8 g/dL (ref 6.0–8.3)
Total Bilirubin: 0.6 mg/dL (ref 0.2–1.2)

## 2017-08-19 DIAGNOSIS — Z5181 Encounter for therapeutic drug level monitoring: Secondary | ICD-10-CM | POA: Diagnosis not present

## 2017-09-17 DIAGNOSIS — Z5181 Encounter for therapeutic drug level monitoring: Secondary | ICD-10-CM | POA: Diagnosis not present

## 2017-09-20 DIAGNOSIS — F209 Schizophrenia, unspecified: Secondary | ICD-10-CM | POA: Diagnosis not present

## 2017-09-20 DIAGNOSIS — F17200 Nicotine dependence, unspecified, uncomplicated: Secondary | ICD-10-CM | POA: Diagnosis not present

## 2017-09-23 ENCOUNTER — Other Ambulatory Visit: Payer: Self-pay | Admitting: Family Medicine

## 2017-10-18 DIAGNOSIS — Z5181 Encounter for therapeutic drug level monitoring: Secondary | ICD-10-CM | POA: Diagnosis not present

## 2017-11-19 DIAGNOSIS — Z5181 Encounter for therapeutic drug level monitoring: Secondary | ICD-10-CM | POA: Diagnosis not present

## 2017-12-04 IMAGING — DX DG CHEST 2V
2 series · 2 of 2 positions shown · non-contrast
Comparison: None.

CLINICAL DATA: Cough.

EXAM:
CHEST  2 VIEW

[chest pa]
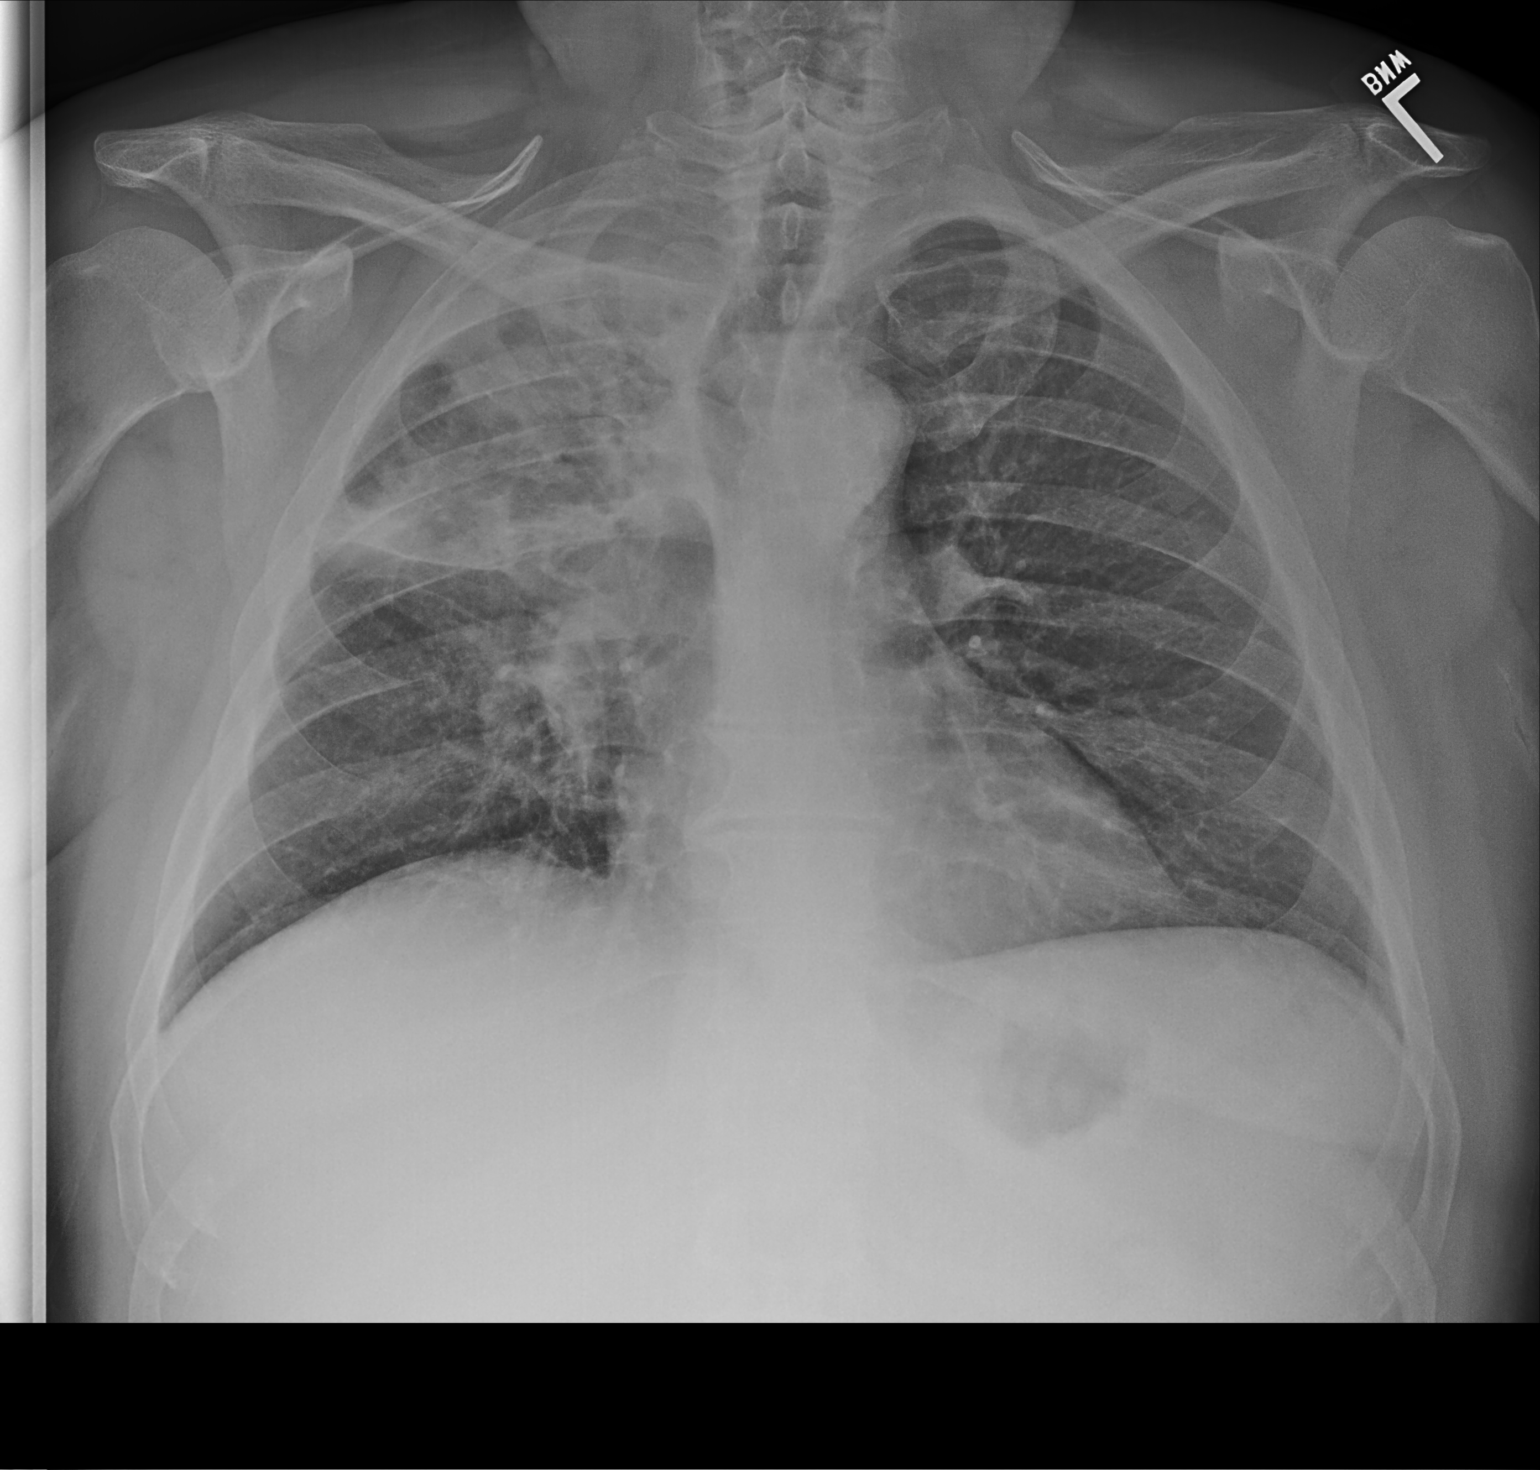

[chest lat]
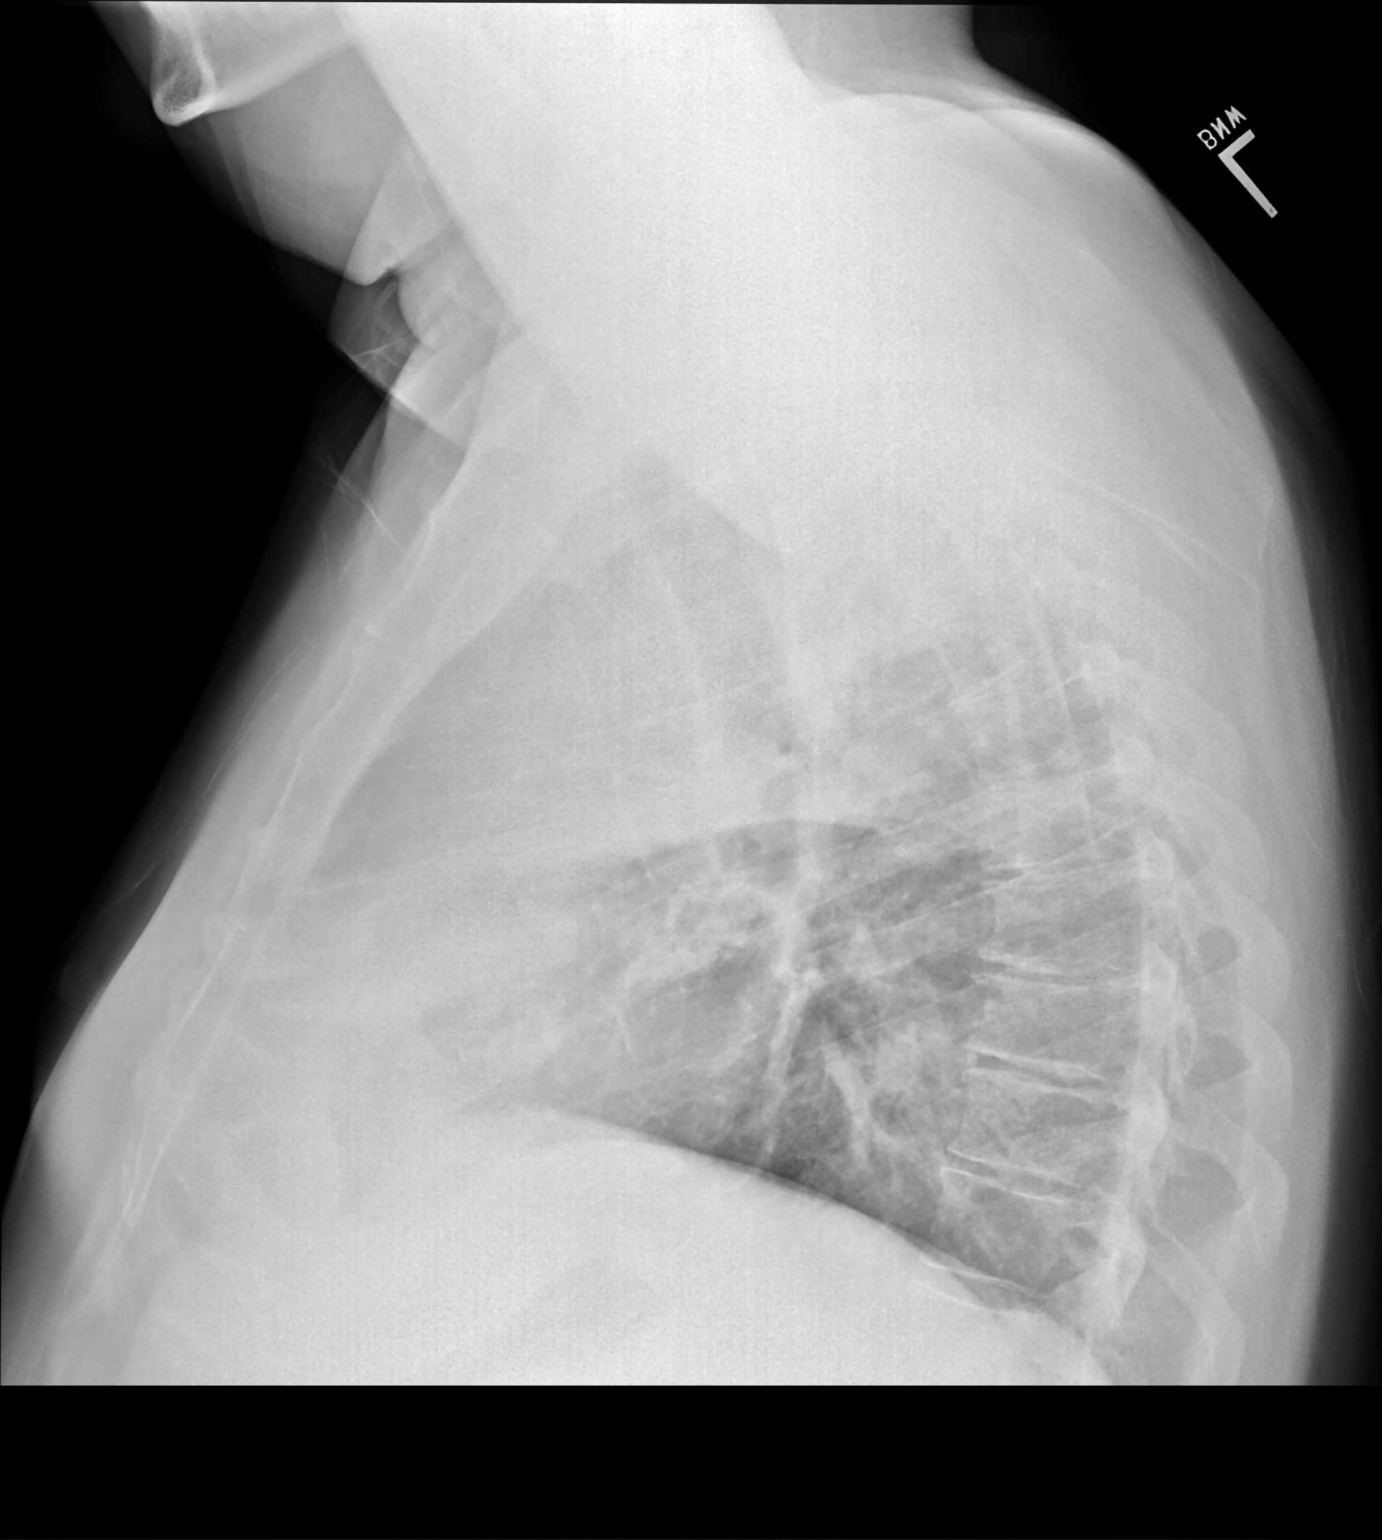

[2 of 2 positions shown; findings below may reference images not displayed]

FINDINGS: The heart size and mediastinal contours are within normal limits. No
pneumothorax or pleural effusion is noted. Left lung is clear. Right
upper lobe airspace opacity is noted concerning for pneumonia. The
visualized skeletal structures are unremarkable.
IMPRESSION: Probable right upper lobe pneumonia. Followup PA and lateral chest
X-ray is recommended in 3-4 weeks following trial of antibiotic
therapy to ensure resolution and exclude underlying malignancy.

## 2017-12-06 ENCOUNTER — Other Ambulatory Visit: Payer: Self-pay | Admitting: Family Medicine

## 2017-12-20 DIAGNOSIS — Z5181 Encounter for therapeutic drug level monitoring: Secondary | ICD-10-CM | POA: Diagnosis not present

## 2018-01-12 ENCOUNTER — Encounter: Payer: Self-pay | Admitting: Family Medicine

## 2018-01-12 ENCOUNTER — Ambulatory Visit (INDEPENDENT_AMBULATORY_CARE_PROVIDER_SITE_OTHER): Payer: PPO | Admitting: Family Medicine

## 2018-01-12 VITALS — BP 160/100 | HR 92 | Temp 98.6°F | Ht 66.0 in | Wt 229.6 lb

## 2018-01-12 DIAGNOSIS — E1169 Type 2 diabetes mellitus with other specified complication: Secondary | ICD-10-CM | POA: Insufficient documentation

## 2018-01-12 DIAGNOSIS — Z794 Long term (current) use of insulin: Secondary | ICD-10-CM | POA: Diagnosis not present

## 2018-01-12 DIAGNOSIS — F200 Paranoid schizophrenia: Secondary | ICD-10-CM

## 2018-01-12 DIAGNOSIS — R030 Elevated blood-pressure reading, without diagnosis of hypertension: Secondary | ICD-10-CM | POA: Diagnosis not present

## 2018-01-12 DIAGNOSIS — E119 Type 2 diabetes mellitus without complications: Secondary | ICD-10-CM

## 2018-01-12 DIAGNOSIS — E785 Hyperlipidemia, unspecified: Secondary | ICD-10-CM | POA: Diagnosis not present

## 2018-01-12 LAB — LIPID PANEL
Cholesterol: 127 mg/dL (ref 0–200)
HDL: 41.1 mg/dL (ref >39.00)
LDL Cholesterol: 53 mg/dL (ref 0–99)
NonHDL: 86.16
TRIGLYCERIDES: 166 mg/dL — AB (ref 0.0–149.0)
Total CHOL/HDL Ratio: 3
VLDL: 33.2 mg/dL (ref 0.0–40.0)

## 2018-01-12 LAB — COMPREHENSIVE METABOLIC PANEL
ALK PHOS: 53 U/L (ref 39–117)
ALT: 14 U/L (ref 0–53)
AST: 14 U/L (ref 0–37)
Albumin: 4.6 g/dL (ref 3.5–5.2)
BILIRUBIN TOTAL: 0.6 mg/dL (ref 0.2–1.2)
BUN: 12 mg/dL (ref 6–23)
CALCIUM: 9.6 mg/dL (ref 8.4–10.5)
CO2: 26 meq/L (ref 19–32)
Chloride: 106 mEq/L (ref 96–112)
Creatinine, Ser: 0.79 mg/dL (ref 0.40–1.50)
GFR: 111.43 mL/min (ref >60.00)
Glucose, Bld: 117 mg/dL — ABNORMAL HIGH (ref 70–99)
POTASSIUM: 4.2 meq/L (ref 3.5–5.1)
Sodium: 140 mEq/L (ref 135–145)
Total Protein: 6.8 g/dL (ref 6.0–8.3)

## 2018-01-12 LAB — HEMOGLOBIN A1C: Hgb A1c MFr Bld: 6.1 % (ref 4.6–6.5)

## 2018-01-12 NOTE — Assessment & Plan Note (Signed)
Patient with elevated blood pressure today.  Had been well controlled previously.  Patient did become quite stressed about his initial blood pressure being mildly elevated and that likely accounted for his blood pressure going up further.  I discussed with him and his father over the phone that a single day of elevated blood pressure does not require treatment.  Discussed checking daily for the next week and coming back in for a nurse visit for recheck.

## 2018-01-12 NOTE — Assessment & Plan Note (Signed)
Check lipid panel.  Continue Crestor. 

## 2018-01-12 NOTE — Assessment & Plan Note (Signed)
Patient will continue to follow with psychiatry.

## 2018-01-12 NOTE — Progress Notes (Signed)
  Tommi Rumps, MD Phone: [phone]  Bobby Clements is a 47 y.o. male who presents today for f/u.  CC: DM, HLD, elevated BP  DIABETES Disease Monitoring: Blood Sugar ranges-109-152   Polyuria/phagia/dipsia- no      Optho- UTD Medications: Compliance- taking lantus and metformin Hypoglycemic symptoms- no  HYPERLIPIDEMIA Symptoms Chest pain on exertion:  no    Medications: Compliance- taking crestor Right upper quadrant pain- no  Muscle aches- no  Elevated blood pressure: No history of this in the past.  No chest pain or shortness of breath.   Schizophrenia: Patient continues to follow with psychiatry.  He continues on his current regimen of clozapine and Prozac.     Social History   Tobacco Use  Smoking Status Former Smoker  . Packs/day: 1.00  Smokeless Tobacco Former Systems developer  . Quit date: 12/14/2016     ROS see history of present illness  Objective  Physical Exam Vitals:   01/12/18 0830 01/12/18 0849  BP: (!) 138/96 (!) 160/100  Pulse: 92   Temp: 98.6 F (37 C)   SpO2: 95%     BP Readings from Last 3 Encounters:  01/12/18 (!) 160/100  07/13/17 114/80  02/01/17 132/78   Wt Readings from Last 3 Encounters:  01/12/18 229 lb 9.6 oz (104.1 kg)  07/13/17 235 lb (106.6 kg)  02/01/17 222 lb (100.7 kg)    Physical Exam  Constitutional: No distress.  Cardiovascular: Normal rate, regular rhythm and normal heart sounds.  Pulmonary/Chest: Effort normal and breath sounds normal.  Musculoskeletal: He exhibits no edema.  Neurological: He is alert.  Skin: Skin is warm and dry. He is not diaphoretic.     Assessment/Plan: Please see individual problem list.  DM (diabetes mellitus), type 2 (HCC) Check A1c.  Continue current regimen.  HLD (hyperlipidemia) Check lipid panel.  Continue Crestor.  Elevated blood pressure reading Patient with elevated blood pressure today.  Had been well controlled previously.  Patient did become quite stressed about his  initial blood pressure being mildly elevated and that likely accounted for his blood pressure going up further.  I discussed with him and his father over the phone that a single day of elevated blood pressure does not require treatment.  Discussed checking daily for the next week and coming back in for a nurse visit for recheck.  Paranoid schizophrenia [facility]) Patient will continue to follow with psychiatry.   Orders Placed This Encounter  Procedures  . Comp Met (CMET)  . Lipid panel  . HgB A1c    No orders of the defined types were placed in this encounter.   Tommi Rumps, MD [place]

## 2018-01-12 NOTE — Patient Instructions (Signed)
Nice to see you. We will get lab work today and contact you with the results. Your blood pressure was mildly elevated today.  Please start checking this at home daily when you have been relaxed for 10 to 15 minutes.  We will have you return in 1 week for recheck.

## 2018-01-12 NOTE — Assessment & Plan Note (Signed)
Check A1c.  Continue current regimen. 

## 2018-01-17 ENCOUNTER — Telehealth: Payer: Self-pay

## 2018-01-17 NOTE — Telephone Encounter (Signed)
Copied from CRM [id number]. Topic: Quick Communication - Lab Results (Clinic Use ONLY) >> Jan 14, 2018  2:52 PM Gracelyn Nurse, [place] wrote: Called patient to inform them of  lab results. When patient returns call, triage nurse may disclose results. >> Jan 17, 2018 12:50 PM Windy Kalata, NT wrote: Patient father Moise Boring is calling and states he has already received these lab results but states he keeps getting calls stating to return the call for labs. Please advise.   [phone]

## 2018-01-17 NOTE — Telephone Encounter (Signed)
Called and spoke with patient's father. Father advised and voiced understanding.

## 2018-01-17 NOTE — Telephone Encounter (Signed)
Noted. Please advise the patient to decrease his Lantus to 20 units daily. He will need to follow-up in 3 to 4 months for an office visit if one has not been scheduled with me already. Thanks.

## 2018-01-20 ENCOUNTER — Ambulatory Visit: Payer: PPO

## 2018-01-21 ENCOUNTER — Ambulatory Visit (INDEPENDENT_AMBULATORY_CARE_PROVIDER_SITE_OTHER): Payer: PPO | Admitting: *Deleted

## 2018-01-21 VITALS — BP 118/78 | HR 91 | Resp 18

## 2018-01-21 DIAGNOSIS — R030 Elevated blood-pressure reading, without diagnosis of hypertension: Secondary | ICD-10-CM | POA: Diagnosis not present

## 2018-01-21 NOTE — Progress Notes (Signed)
Patient here for nurse visit BP check per order from 01/12/18.   Patient reports compliance with prescribed BP medications: yes patient is not on medication, patient is compliant with requested Home BP reading given to PCP.  Last dose of BP medication:   BP Readings from Last 3 Encounters:  01/21/18 116/78  01/12/18 (!) 160/100  07/13/17 114/80   Pulse Readings from Last 3 Encounters:  01/21/18 89  01/12/18 92  07/13/17 95      Patient verbalized understanding of instructions.   Henrene Pastor, LPN

## 2018-01-23 NOTE — Progress Notes (Signed)
Patient's blood pressure is very well controlled.  He should continue to monitor periodically at home.

## 2018-01-24 DIAGNOSIS — F209 Schizophrenia, unspecified: Secondary | ICD-10-CM | POA: Diagnosis not present

## 2018-01-26 NOTE — Progress Notes (Signed)
Patient notified and voiced understanding.

## 2018-02-04 DIAGNOSIS — Z5181 Encounter for therapeutic drug level monitoring: Secondary | ICD-10-CM | POA: Diagnosis not present

## 2018-03-01 ENCOUNTER — Other Ambulatory Visit: Payer: Self-pay | Admitting: Family Medicine

## 2018-03-10 DIAGNOSIS — Z5181 Encounter for therapeutic drug level monitoring: Secondary | ICD-10-CM | POA: Diagnosis not present

## 2018-04-08 ENCOUNTER — Other Ambulatory Visit: Payer: Self-pay | Admitting: Pharmacist

## 2018-04-08 NOTE — Patient Outreach (Signed)
[facility]) Care Management  04/08/2018  CREIGHTON WIX 04/26/1970 [id number]   Incoming call from Archer Asa (father), on behalf of ARLESTER ARREDONDO, in response to the [facility] Medication Adherence Campaign. Note that patient's father is listed in chart under patient's designated party release as having permission to receive medical information on his behalf. HIPAA identifiers verified and verbal consent received.  Patient's father reports that the patient primarily manages his medication himself, but that father helps his son too. Reviews patient's pill bottles and reports that it appears that he has not currently been taking the rosuvastatin. Note that per chart, rosuvastatin 20 mg is listed on the patient's medication list. Reports that Sekani is outside right now, but that he will follow up with him about the medication when he comes back inside. States that he will follow up with patient's PCP as necessary regarding the medication.  Provide my phone number.  Will close pharmacy episode.  Duanne Moron, PharmD, [facility] Clinical Pharmacist [facility] Care Management [phone]

## 2018-04-11 DIAGNOSIS — Z5181 Encounter for therapeutic drug level monitoring: Secondary | ICD-10-CM | POA: Diagnosis not present

## 2018-04-18 ENCOUNTER — Ambulatory Visit (INDEPENDENT_AMBULATORY_CARE_PROVIDER_SITE_OTHER): Payer: PPO | Admitting: Family Medicine

## 2018-04-18 ENCOUNTER — Encounter: Payer: Self-pay | Admitting: Family Medicine

## 2018-04-18 VITALS — BP 118/72 | HR 97 | Temp 98.5°F | Wt 236.4 lb

## 2018-04-18 DIAGNOSIS — E119 Type 2 diabetes mellitus without complications: Secondary | ICD-10-CM | POA: Diagnosis not present

## 2018-04-18 DIAGNOSIS — Z794 Long term (current) use of insulin: Secondary | ICD-10-CM

## 2018-04-18 DIAGNOSIS — D649 Anemia, unspecified: Secondary | ICD-10-CM | POA: Diagnosis not present

## 2018-04-18 DIAGNOSIS — Z23 Encounter for immunization: Secondary | ICD-10-CM

## 2018-04-18 DIAGNOSIS — E785 Hyperlipidemia, unspecified: Secondary | ICD-10-CM

## 2018-04-18 LAB — POCT GLYCOSYLATED HEMOGLOBIN (HGB A1C): HEMOGLOBIN A1C: 5.9 % — AB (ref 4.0–5.6)

## 2018-04-18 MED ORDER — TETANUS-DIPHTH-ACELL PERTUSSIS 5-2.5-18.5 LF-MCG/0.5 IM SUSP: 0.5000 mL | Freq: Once | INTRAMUSCULAR | 0 refills | Status: AC

## 2018-04-18 NOTE — Assessment & Plan Note (Signed)
Continue Crestor 

## 2018-04-18 NOTE — Assessment & Plan Note (Signed)
History of this.  Review of iron studies revealed likely anemia of chronic disease versus inflammation.  We will have him discontinue iron and recheck labs in about 1 month.

## 2018-04-18 NOTE — Assessment & Plan Note (Signed)
Continue current regimen.  Check A1c today.  Continue activity.  Encouraged to try to avoid chicken livers as much as possible and to certainly not increase from once weekly eating them.

## 2018-04-18 NOTE — Patient Instructions (Signed)
Nice to see you. Please continue with exercise. Please try to limit eating chicken livers due to their high cholesterol content.  Please do not increase this more than your ER. Please discontinue your iron supplement.  We will have you return in about a month for repeat labs. Please get your tetanus vaccine from your pharmacy.

## 2018-04-18 NOTE — Progress Notes (Signed)
  Marikay Alar, MD Phone: [phone]  Bobby Clements is a 48 y.o. male who presents today for f/u.  CC: dm, history of anemia, hld  Diabetes: Running 95-152.  Taking Lantus 20 units daily.  Also taking metformin.  No polyuria or polydipsia.  He follows with ophthalmology and is due for a visit.  He walks about 10 miles daily.  He likes green beans and hamburger steak as well as chicken livers.  Anemia: Patient has a history of this.  The anemia appears to have been in the setting of infection.  Iron studies appear to have been in a pattern of chronic disease versus inflammation.  He has been on iron supplementation since then.  He notes no hematuria or blood in his stool.  No abdominal pain.  No melena.  Hyperlipidemia: Taking Crestor.  No chest pain, right upper quadrant pain, or myalgias.  Social History   Tobacco Use  Smoking Status Former Smoker  . Packs/day: 1.00  Smokeless Tobacco Former Neurosurgeon  . Quit date: 12/14/2016     ROS see history of present illness  Objective  Physical Exam Vitals:   04/18/18 1036  BP: 118/72  Pulse: 97  Temp: 98.5 F (36.9 C)  SpO2: 95%    BP Readings from Last 3 Encounters:  04/18/18 118/72  01/21/18 118/78  01/12/18 (!) 160/100   Wt Readings from Last 3 Encounters:  04/18/18 236 lb 6.4 oz (107.2 kg)  01/12/18 229 lb 9.6 oz (104.1 kg)  07/13/17 235 lb (106.6 kg)    Physical Exam Constitutional:      General: He is not in acute distress.    Appearance: He is not diaphoretic.  Cardiovascular:     Rate and Rhythm: Normal rate and regular rhythm.     Heart sounds: Normal heart sounds.  Pulmonary:     Effort: Pulmonary effort is normal.     Breath sounds: Normal breath sounds.  Musculoskeletal:     Right lower leg: No edema.     Left lower leg: No edema.  Skin:    General: Skin is warm and dry.  Neurological:     Mental Status: He is alert.      Assessment/Plan: Please see individual problem list.  DM (diabetes  mellitus), type 2 (HCC) Continue current regimen.  Check A1c today.  Continue activity.  Encouraged to try to avoid chicken livers as much as possible and to certainly not increase from once weekly eating them.  Anemia History of this.  Review of iron studies revealed likely anemia of chronic disease versus inflammation.  We will have him discontinue iron and recheck labs in about 1 month.  HLD (hyperlipidemia) Continue Crestor.   Health Maintenance: Tdap prescription given to get at pharmacy.  Orders Placed This Encounter  Procedures  . Flu Vaccine QUAD 6+ mos PF IM (Fluarix Quad PF)  . CBC    Standing Status:   Future    Standing Expiration Date:   04/19/2019  . Iron, TIBC and Ferritin Panel    Standing Status:   Future    Standing Expiration Date:   04/19/2019  . POCT HgB A1C    Meds ordered this encounter  Medications  . Tdap (BOOSTRIX) 5-2.5-18.5 LF-MCG/0.5 injection    Sig: Inject 0.5 mLs into the muscle once for 1 dose.    Dispense:  0.5 mL    Refill:  0     Marikay Alar, MD [facility] Primary Care [facility]

## 2018-04-19 ENCOUNTER — Other Ambulatory Visit: Payer: Self-pay | Admitting: Family Medicine

## 2018-05-11 DIAGNOSIS — Z5181 Encounter for therapeutic drug level monitoring: Secondary | ICD-10-CM | POA: Diagnosis not present

## 2018-05-17 ENCOUNTER — Other Ambulatory Visit (INDEPENDENT_AMBULATORY_CARE_PROVIDER_SITE_OTHER): Payer: PPO

## 2018-05-17 DIAGNOSIS — D649 Anemia, unspecified: Secondary | ICD-10-CM | POA: Diagnosis not present

## 2018-05-17 NOTE — Addendum Note (Signed)
Addended by: WIGGINS, Kajuana Shareef N on: 05/17/2018 08:16 AM   Modules accepted: Orders  

## 2018-05-18 LAB — CBC
HCT: 42.6 % (ref 38.5–50.0)
Hemoglobin: 14.5 g/dL (ref 13.2–17.1)
MCH: 29.9 pg (ref 27.0–33.0)
MCHC: 34 g/dL (ref 32.0–36.0)
MCV: 87.8 fL (ref 80.0–100.0)
MPV: 10.2 fL (ref 7.5–12.5)
Platelets: 208 10*3/uL (ref 140–400)
RBC: 4.85 10*6/uL (ref 4.20–5.80)
RDW: 13 % (ref 11.0–15.0)
WBC: 5.6 10*3/uL (ref 3.8–10.8)

## 2018-05-18 LAB — IRON,TIBC AND FERRITIN PANEL
%SAT: 34 % (calc) (ref 20–48)
Ferritin: 54 ng/mL (ref 38–380)
Iron: 96 ug/dL (ref 50–180)
TIBC: 283 mcg/dL (calc) (ref 250–425)

## 2018-05-23 DIAGNOSIS — F209 Schizophrenia, unspecified: Secondary | ICD-10-CM | POA: Diagnosis not present

## 2018-06-14 DIAGNOSIS — Z5181 Encounter for therapeutic drug level monitoring: Secondary | ICD-10-CM | POA: Diagnosis not present

## 2018-07-01 ENCOUNTER — Other Ambulatory Visit: Payer: Self-pay | Admitting: Family Medicine

## 2018-07-13 DIAGNOSIS — Z5181 Encounter for therapeutic drug level monitoring: Secondary | ICD-10-CM | POA: Diagnosis not present

## 2018-08-10 DIAGNOSIS — Z5181 Encounter for therapeutic drug level monitoring: Secondary | ICD-10-CM | POA: Diagnosis not present

## 2018-08-19 ENCOUNTER — Ambulatory Visit: Payer: PPO | Admitting: Family Medicine

## 2018-08-24 ENCOUNTER — Other Ambulatory Visit: Payer: Self-pay

## 2018-08-26 ENCOUNTER — Other Ambulatory Visit: Payer: Self-pay

## 2018-08-26 ENCOUNTER — Encounter: Payer: Self-pay | Admitting: Family Medicine

## 2018-08-26 ENCOUNTER — Ambulatory Visit (INDEPENDENT_AMBULATORY_CARE_PROVIDER_SITE_OTHER): Payer: PPO | Admitting: Family Medicine

## 2018-08-26 DIAGNOSIS — E119 Type 2 diabetes mellitus without complications: Secondary | ICD-10-CM

## 2018-08-26 DIAGNOSIS — D649 Anemia, unspecified: Secondary | ICD-10-CM

## 2018-08-26 DIAGNOSIS — E785 Hyperlipidemia, unspecified: Secondary | ICD-10-CM

## 2018-08-26 DIAGNOSIS — Z794 Long term (current) use of insulin: Secondary | ICD-10-CM

## 2018-08-26 NOTE — Assessment & Plan Note (Signed)
Resolved.  No longer on iron.  He can remain off of this.

## 2018-08-26 NOTE — Progress Notes (Signed)
Virtual Visit via video Note  This visit type was conducted due to national recommendations for restrictions regarding the COVID-19 pandemic (e.g. social distancing).  This format is felt to be most appropriate for this patient at this time.  All issues noted in this document were discussed and addressed.  No physical exam was performed (except for noted visual exam findings with Video Visits).   I connected with Luiz BlareJason Stanke today at  2:45 PM EDT by a video enabled telemedicine application and verified that I am speaking with the correct person using two identifiers. Location patient: home Location provider: work or home office Persons participating in the virtual visit: patient, provider, Gretchen PortelaSharon Cragin (mom)  I discussed the limitations, risks, security and privacy concerns of performing an evaluation and management service by telephone and the availability of in person appointments. I also discussed with the patient that there may be a patient responsible charge related to this service. The patient expressed understanding and agreed to proceed.   Reason for visit: Follow-up.  HPI: Diabetes: Typically running 110-140.  Taking metformin.  Taking Lantus 18 units daily.  No polyuria or polydipsia.  No hypoglycemia.  He is due for ophthalmology though he is going to delay that until the COVID-19 pandemic calms down.  Hyperlipidemia: Taking Crestor.  No chest pain, shortness of breath, right upper quadrant pain, or myalgias.  He is walking for exercise.  He is no longer smoking.  He has not smoked in the year and a half.  He has not been checking blood pressures at home.  Anemia: He is come off of iron.  His repeat iron studies were in the acceptable range. No hematuria or blood in the stool.  ROS: See pertinent positives and negatives per HPI.  Past Medical History:  Diagnosis Date  . Paranoid schizophrenia [facility](HCC)     Past Surgical History:  Procedure Laterality Date  . NO PAST  SURGERIES      Family History  Adopted: Yes    SOCIAL HX: Former smoker.   Current Outpatient Medications:  .  cloZAPine (CLOZARIL) 100 MG tablet, Take 100 mg by mouth 2 (two) times daily. Take one tablet in the morning and two tablets at night, Disp: , Rfl:  .  FLUoxetine (PROZAC) 40 MG capsule, Take 40 mg by mouth at bedtime. , Disp: , Rfl:  .  Insulin Pen Needle (ULTICARE MINI PEN NEEDLES) 31G X 6 MM MISC, USE AS INSTRUCTED TO INJECT INSULIN ONCEA DAY, Disp: 100 each, Rfl: 4 .  LANTUS SOLOSTAR 100 UNIT/ML Solostar Pen, INJECT 22 UNITS SUBCUTANEOUSLY DAILY., Disp: 15 mL, Rfl: 1 .  metFORMIN (GLUCOPHAGE) 500 MG tablet, TAKE 1 TABLET BY MOUTH 2 TIMES DAILY WITH A MEAL, Disp: 180 tablet, Rfl: 3 .  rosuvastatin (CRESTOR) 20 MG tablet, TAKE 1 TABLET BY MOUTH DAILY., Disp: 90 tablet, Rfl: 3  EXAM:  VITALS per patient if applicable: None.  GENERAL: alert, oriented, appears well and in no acute distress  HEENT: atraumatic, conjunttiva clear, no obvious abnormalities on inspection of external nose and ears  NECK: normal movements of the head and neck  LUNGS: on inspection no signs of respiratory distress, breathing rate appears normal, no obvious gross SOB, gasping or wheezing  CV: no obvious cyanosis  MS: moves all visible extremities without noticeable abnormality  PSYCH/NEURO: pleasant and cooperative, no obvious depression or anxiety, speech and thought processing grossly intact  ASSESSMENT AND PLAN:  Discussed the following assessment and plan:  DM (diabetes mellitus), type  2 ([place]) Seems to be well controlled.  We will plan on lab work with his next office visit.  Continue current regimen.  Encouraged him to schedule with ophthalmology when he feels it is safe for him to do so.  Anemia Resolved.  No longer on iron.  He can remain off of this.  HLD (hyperlipidemia) Continue on Crestor.  [place] office staff will contact the patient to get him scheduled for follow-up in 3  months.  Social distancing precautions and sick precautions given regarding COVID-19.   I discussed the assessment and treatment plan with the patient. The patient was provided an opportunity to ask questions and all were answered. The patient agreed with the plan and demonstrated an understanding of the instructions.   The patient was advised to call back or seek an in-person evaluation if the symptoms worsen or if the condition fails to improve as anticipated.   Tommi Rumps, MD

## 2018-08-26 NOTE — Assessment & Plan Note (Signed)
Seems to be well controlled.  We will plan on lab work with his next office visit.  Continue current regimen.  Encouraged him to schedule with ophthalmology when he feels it is safe for him to do so.

## 2018-08-26 NOTE — Assessment & Plan Note (Signed)
Continue on Crestor.

## 2018-09-12 DIAGNOSIS — Z5181 Encounter for therapeutic drug level monitoring: Secondary | ICD-10-CM | POA: Diagnosis not present

## 2018-09-19 DIAGNOSIS — Z79899 Other long term (current) drug therapy: Secondary | ICD-10-CM | POA: Diagnosis not present

## 2018-09-19 DIAGNOSIS — F209 Schizophrenia, unspecified: Secondary | ICD-10-CM | POA: Diagnosis not present

## 2018-10-10 DIAGNOSIS — Z5181 Encounter for therapeutic drug level monitoring: Secondary | ICD-10-CM | POA: Diagnosis not present

## 2018-10-18 ENCOUNTER — Other Ambulatory Visit: Payer: Self-pay | Admitting: Family Medicine

## 2018-10-18 IMAGING — CR DG CHEST 2V
1 series · 2 of 2 positions shown · non-contrast
Comparison: Chest CT July 13, 2016

CLINICAL DATA: Recent pneumonia

EXAM:
CHEST  2 VIEW

[Series 1: dg chest 2 view · 0.14mm/px · 2 of 2 slices shown]
[im 1/2]
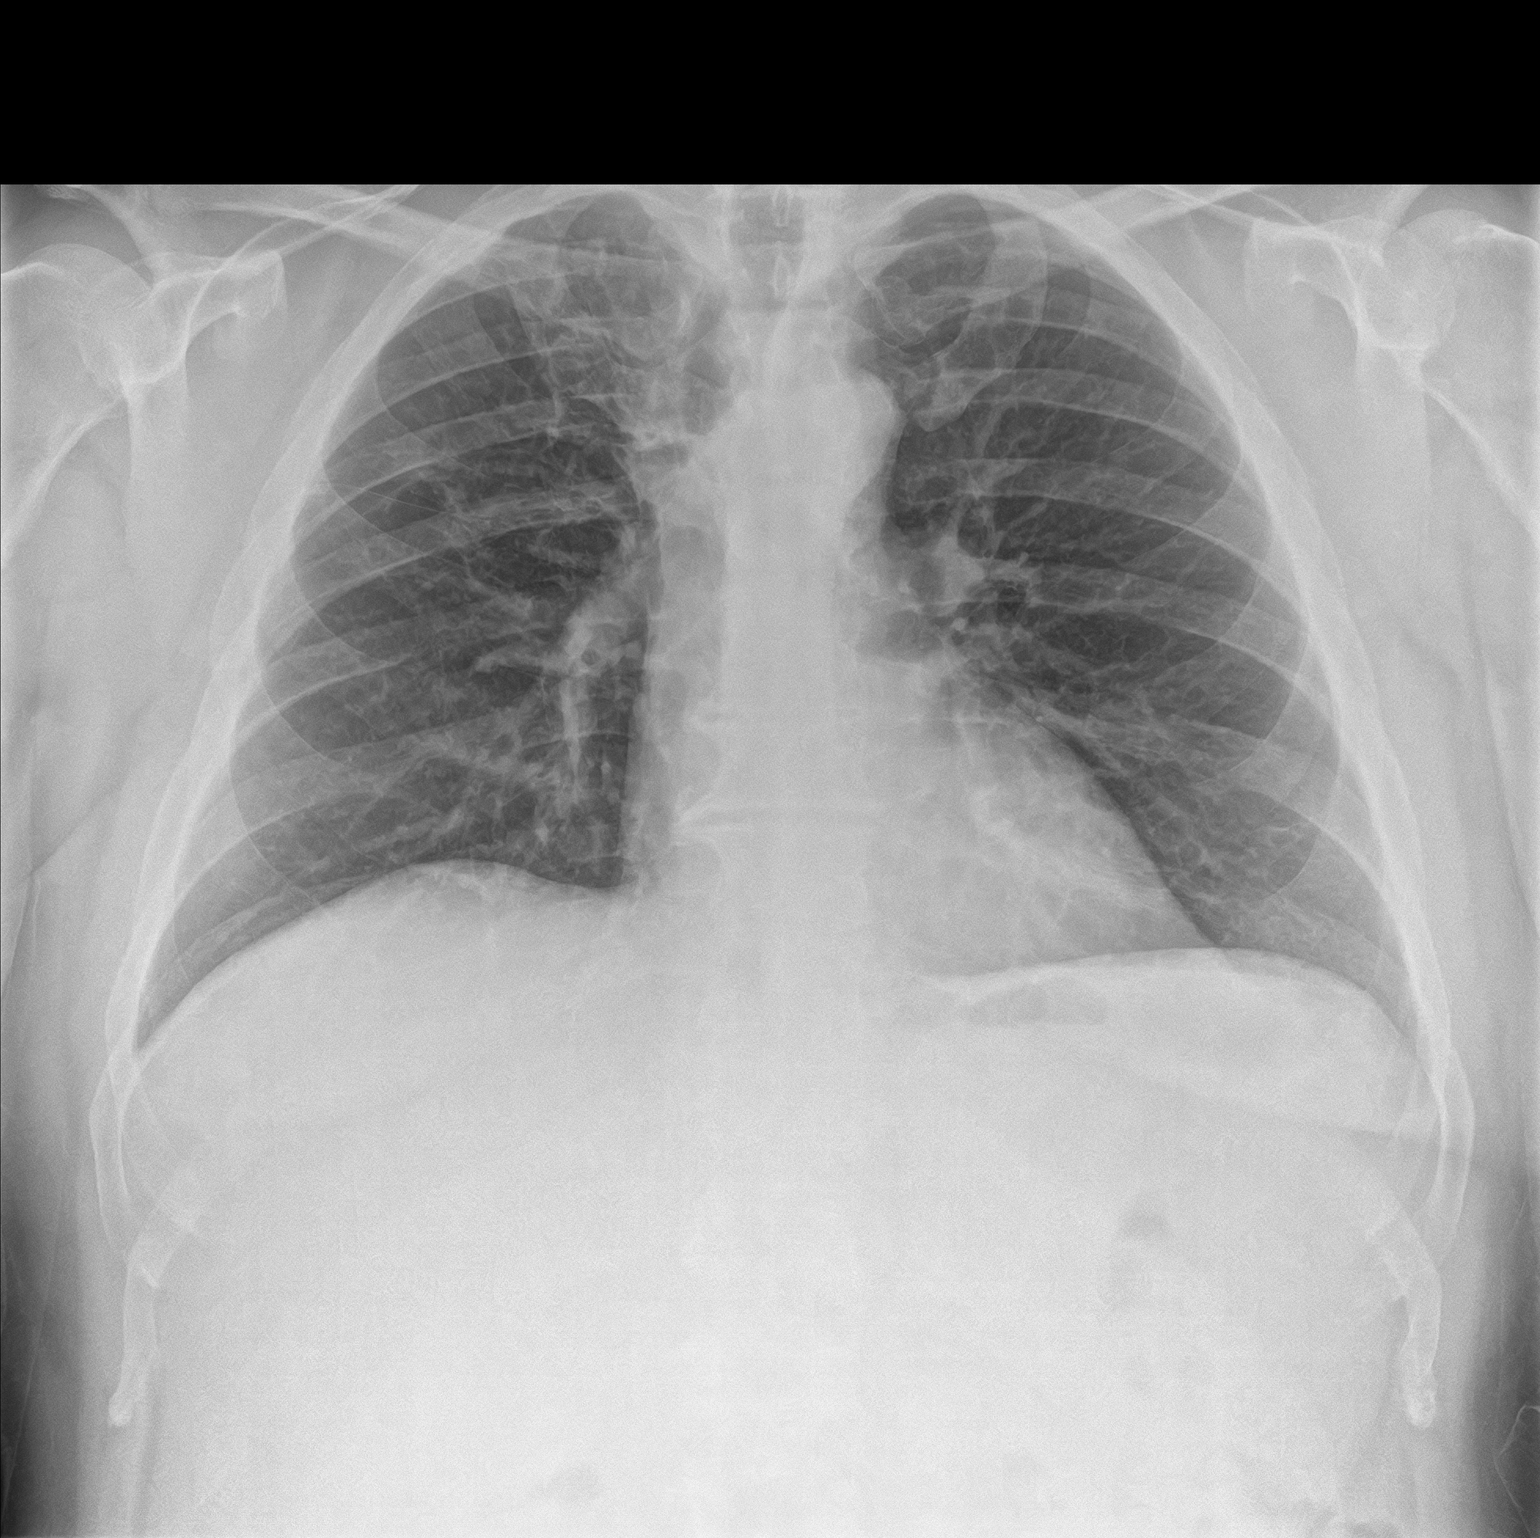
[im 2/2]
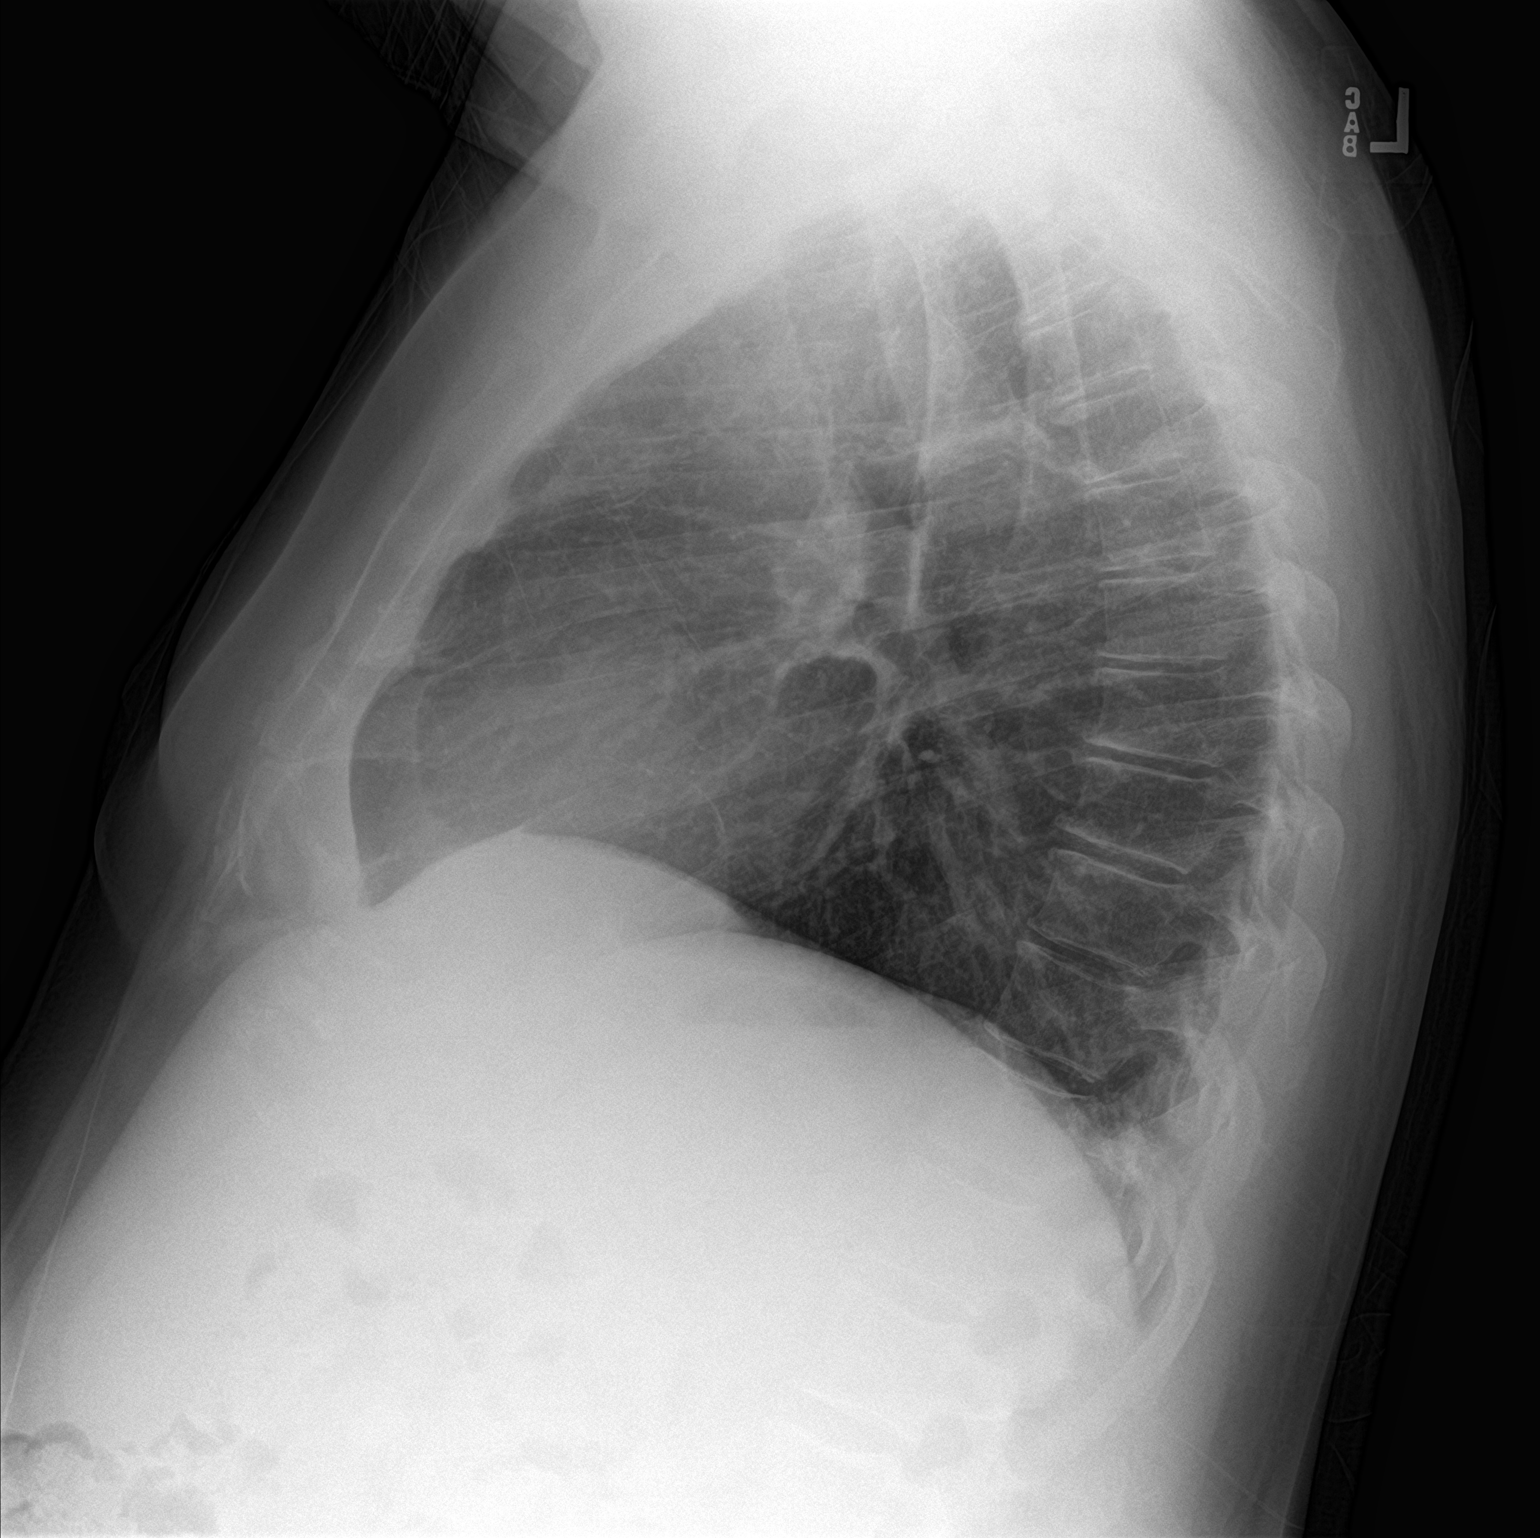

[2 of 2 positions shown; findings below may reference images not displayed]

FINDINGS: Scarring and localized bronchiectasis in the right apex is stable.
There is no edema or consolidation. Heart size and pulmonary
vascularity are normal. No adenopathy. No bone lesions evident.
IMPRESSION: Persistent scarring and bronchiectasis in the right apical region.
Lungs elsewhere clear. Heart size within normal limits.

## 2018-11-18 DIAGNOSIS — Z5181 Encounter for therapeutic drug level monitoring: Secondary | ICD-10-CM | POA: Diagnosis not present

## 2018-12-23 DIAGNOSIS — F209 Schizophrenia, unspecified: Secondary | ICD-10-CM | POA: Diagnosis not present

## 2019-01-11 ENCOUNTER — Other Ambulatory Visit: Payer: Self-pay | Admitting: Family Medicine

## 2019-01-17 DIAGNOSIS — L0201 Cutaneous abscess of face: Secondary | ICD-10-CM | POA: Diagnosis not present

## 2019-01-23 DIAGNOSIS — F209 Schizophrenia, unspecified: Secondary | ICD-10-CM | POA: Diagnosis not present

## 2019-01-31 DIAGNOSIS — F209 Schizophrenia, unspecified: Secondary | ICD-10-CM | POA: Diagnosis not present

## 2019-03-08 DIAGNOSIS — F209 Schizophrenia, unspecified: Secondary | ICD-10-CM | POA: Diagnosis not present

## 2019-03-22 ENCOUNTER — Other Ambulatory Visit: Payer: Self-pay | Admitting: Family Medicine

## 2019-04-10 DIAGNOSIS — F209 Schizophrenia, unspecified: Secondary | ICD-10-CM | POA: Diagnosis not present

## 2019-04-11 DIAGNOSIS — L0211 Cutaneous abscess of neck: Secondary | ICD-10-CM | POA: Diagnosis not present

## 2019-05-08 DIAGNOSIS — F209 Schizophrenia, unspecified: Secondary | ICD-10-CM | POA: Diagnosis not present

## 2019-05-15 DIAGNOSIS — F209 Schizophrenia, unspecified: Secondary | ICD-10-CM | POA: Diagnosis not present

## 2019-05-16 ENCOUNTER — Other Ambulatory Visit: Payer: Self-pay | Admitting: Family Medicine

## 2019-06-06 DIAGNOSIS — F209 Schizophrenia, unspecified: Secondary | ICD-10-CM | POA: Diagnosis not present

## 2019-06-20 ENCOUNTER — Other Ambulatory Visit: Payer: Self-pay | Admitting: Family Medicine

## 2019-06-21 ENCOUNTER — Telehealth: Payer: Self-pay | Admitting: Family Medicine

## 2019-06-21 NOTE — Telephone Encounter (Signed)
Left message for patient to call back and schedule Medicare Annual Wellness Visit (AWV) either virtually or audio only.  No hx of AWV; please schedule at anytime with Denisa O'Brien-Blaney at [facility] [place]   

## 2019-07-10 DIAGNOSIS — F209 Schizophrenia, unspecified: Secondary | ICD-10-CM | POA: Diagnosis not present

## 2019-07-25 ENCOUNTER — Other Ambulatory Visit: Payer: Self-pay | Admitting: Family Medicine

## 2019-08-08 DIAGNOSIS — F209 Schizophrenia, unspecified: Secondary | ICD-10-CM | POA: Diagnosis not present

## 2019-08-22 ENCOUNTER — Other Ambulatory Visit: Payer: Self-pay | Admitting: Family Medicine

## 2019-09-11 DIAGNOSIS — F209 Schizophrenia, unspecified: Secondary | ICD-10-CM | POA: Diagnosis not present

## 2019-09-11 DIAGNOSIS — Z79899 Other long term (current) drug therapy: Secondary | ICD-10-CM | POA: Diagnosis not present

## 2019-09-11 DIAGNOSIS — F419 Anxiety disorder, unspecified: Secondary | ICD-10-CM | POA: Diagnosis not present

## 2019-09-25 ENCOUNTER — Other Ambulatory Visit: Payer: Self-pay | Admitting: Family Medicine

## 2019-10-10 DIAGNOSIS — F209 Schizophrenia, unspecified: Secondary | ICD-10-CM | POA: Diagnosis not present

## 2019-11-13 DIAGNOSIS — F209 Schizophrenia, unspecified: Secondary | ICD-10-CM | POA: Diagnosis not present

## 2019-12-12 ENCOUNTER — Ambulatory Visit: Payer: PPO | Admitting: Family Medicine

## 2019-12-12 DIAGNOSIS — F209 Schizophrenia, unspecified: Secondary | ICD-10-CM | POA: Diagnosis not present

## 2019-12-13 ENCOUNTER — Other Ambulatory Visit: Payer: Self-pay

## 2019-12-13 ENCOUNTER — Telehealth: Payer: Self-pay | Admitting: Pharmacist

## 2019-12-13 ENCOUNTER — Ambulatory Visit (INDEPENDENT_AMBULATORY_CARE_PROVIDER_SITE_OTHER): Payer: PPO | Admitting: Family Medicine

## 2019-12-13 ENCOUNTER — Encounter: Payer: Self-pay | Admitting: Family Medicine

## 2019-12-13 VITALS — BP 110/70 | HR 94 | Temp 98.5°F | Ht 65.0 in | Wt 227.6 lb

## 2019-12-13 DIAGNOSIS — F200 Paranoid schizophrenia: Secondary | ICD-10-CM

## 2019-12-13 DIAGNOSIS — Z23 Encounter for immunization: Secondary | ICD-10-CM | POA: Diagnosis not present

## 2019-12-13 DIAGNOSIS — L989 Disorder of the skin and subcutaneous tissue, unspecified: Secondary | ICD-10-CM | POA: Diagnosis not present

## 2019-12-13 DIAGNOSIS — E785 Hyperlipidemia, unspecified: Secondary | ICD-10-CM

## 2019-12-13 DIAGNOSIS — Z794 Long term (current) use of insulin: Secondary | ICD-10-CM | POA: Diagnosis not present

## 2019-12-13 DIAGNOSIS — E119 Type 2 diabetes mellitus without complications: Secondary | ICD-10-CM

## 2019-12-13 LAB — COMPREHENSIVE METABOLIC PANEL
ALT: 15 U/L (ref 0–53)
AST: 14 U/L (ref 0–37)
Albumin: 4.5 g/dL (ref 3.5–5.2)
Alkaline Phosphatase: 49 U/L (ref 39–117)
BUN: 14 mg/dL (ref 6–23)
CO2: 26 mEq/L (ref 19–32)
Calcium: 9.6 mg/dL (ref 8.4–10.5)
Chloride: 105 mEq/L (ref 96–112)
Creatinine, Ser: 0.73 mg/dL (ref 0.40–1.50)
GFR: 113.93 mL/min (ref >60.00)
Glucose, Bld: 107 mg/dL — ABNORMAL HIGH (ref 70–99)
Potassium: 4.2 mEq/L (ref 3.5–5.1)
Sodium: 139 mEq/L (ref 135–145)
Total Bilirubin: 0.9 mg/dL (ref 0.2–1.2)
Total Protein: 6.6 g/dL (ref 6.0–8.3)

## 2019-12-13 LAB — LIPID PANEL
Cholesterol: 129 mg/dL (ref 0–200)
HDL: 44.7 mg/dL (ref >39.00)
LDL Cholesterol: 54 mg/dL (ref 0–99)
NonHDL: 84.46
Total CHOL/HDL Ratio: 3
Triglycerides: 153 mg/dL — ABNORMAL HIGH (ref 0.0–149.0)
VLDL: 30.6 mg/dL (ref 0.0–40.0)

## 2019-12-13 LAB — MICROALBUMIN / CREATININE URINE RATIO
Creatinine,U: 110.2 mg/dL
Microalb Creat Ratio: 0.6 mg/g (ref 0.0–30.0)
Microalb, Ur: <0.7 mg/dL (ref 0.0–1.9)

## 2019-12-13 LAB — HEMOGLOBIN A1C: Hgb A1c MFr Bld: 6.1 % (ref 4.6–6.5)

## 2019-12-13 MED ORDER — NYSTATIN 100000 UNIT/GM EX CREA: 1.0000 "application " | TOPICAL_CREAM | Freq: Two times a day (BID) | CUTANEOUS | 0 refills | Status: DC

## 2019-12-13 NOTE — Assessment & Plan Note (Signed)
Seems to be well controlled.  We will check an A1c.  He will continue Lantus 18 units daily and Metformin 500 mg twice daily.

## 2019-12-13 NOTE — Assessment & Plan Note (Signed)
Patient notes he is stable.  He will continue to see psychiatry.

## 2019-12-13 NOTE — Progress Notes (Signed)
Tommi Rumps, MD Phone: [phone]  Bobby BISSONETTE is a 49 y.o. male who presents today for f/u.  DIABETES Disease Monitoring: Blood Sugar ranges-101-137 Polyuria/phagia/dipsia- no      Medications: Compliance- taking lantus 18 u daily, metformin Hypoglycemic symptoms- no Walks 5 to 7 miles daily.  Does eat some carbs.  Likes green beans and broccoli.  Occasional hamburger meat.  Sore skin in groin: This is been going on for a few months.  It comes and goes.  Occasional mild drainage from it.  He has been putting a Band-Aid over it.  No fevers.  History of paranoid schizophrenia: No depression or anxiety.  No auditory or visual hallucinations.  He is managed by psychiatry on clozapine and fluoxetine.  Social History   Tobacco Use  Smoking Status Former Smoker  . Packs/day: 1.00  Smokeless Tobacco Former Systems developer  . Quit date: 12/14/2016     ROS see history of present illness  Objective  Physical Exam Vitals:   12/13/19 1110  BP: 110/70  Pulse: 94  Temp: 98.5 F (36.9 C)  SpO2: 96%    BP Readings from Last 3 Encounters:  12/13/19 110/70  04/18/18 118/72  01/21/18 118/78   Wt Readings from Last 3 Encounters:  12/13/19 227 lb 9.6 oz (103.2 kg)  04/18/18 236 lb 6.4 oz (107.2 kg)  01/12/18 229 lb 9.6 oz (104.1 kg)    Physical Exam Constitutional:      General: He is not in acute distress.    Appearance: He is not diaphoretic.  Cardiovascular:     Rate and Rhythm: Normal rate and regular rhythm.     Heart sounds: Normal heart sounds.  Pulmonary:     Effort: Pulmonary effort is normal.     Breath sounds: Normal breath sounds.  Skin:    General: Skin is warm and dry.       Neurological:     Mental Status: He is alert.      Assessment/Plan: Please see individual problem list.  DM (diabetes mellitus), type 2 ([place]) Seems to be well controlled.  We will check an A1c.  He will continue Lantus 18 units daily and Metformin 500 mg twice daily.  HLD  (hyperlipidemia) Check lipid panel.  Skin lesion Possible candidal intertrigo given location.  We will trial nystatin.  If not beneficial to let us know we could trial a topical steroid.  Paranoid schizophrenia [facility]) Patient notes he is stable.  He will continue to see psychiatry.    Orders Placed This Encounter  Procedures  . Flu Vaccine QUAD 36+ mos IM  . HgB A1c  . Comp Met (CMET)  . Lipid panel  . Urine Microalbumin w/creat. ratio    Meds ordered this encounter  Medications  . nystatin cream (MYCOSTATIN)    Sig: Apply 1 application topically 2 (two) times daily.    Dispense:  30 g    Refill:  0    Bobby Clements was seen today for follow-up.  Diagnoses and all orders for this visit:  Type 2 diabetes mellitus without complication, with long-term current use of insulin (HCC) -     HgB A1c -     Urine Microalbumin w/creat. ratio  Need for immunization against influenza -     Flu Vaccine QUAD 36+ mos IM  Hyperlipidemia, unspecified hyperlipidemia type -     Comp Met (CMET) -     Lipid panel  Skin lesion  Paranoid schizophrenia ([place])  Other orders -  nystatin cream (MYCOSTATIN); Apply 1 application topically 2 (two) times daily.     This visit occurred during the SARS-CoV-2 public health emergency.  Safety protocols were in place, including screening questions prior to the visit, additional usage of staff PPE, and extensive cleaning of exam room while observing appropriate contact time as indicated for disinfecting solutions.    Tommi Rumps, MD [place]

## 2019-12-13 NOTE — Assessment & Plan Note (Signed)
Possible candidal intertrigo given location.  We will trial nystatin.  If not beneficial to let us know we could trial a topical steroid.

## 2019-12-13 NOTE — Progress Notes (Signed)
error 

## 2019-12-13 NOTE — Patient Instructions (Signed)
Nice to see you. We will get some labs today and contact you with the results. Please let us know if the topical treatment does not work for the area in your groin.

## 2019-12-13 NOTE — Assessment & Plan Note (Signed)
Check lipid panel  

## 2020-01-02 ENCOUNTER — Other Ambulatory Visit: Payer: Self-pay | Admitting: Family Medicine

## 2020-01-09 DIAGNOSIS — F419 Anxiety disorder, unspecified: Secondary | ICD-10-CM | POA: Diagnosis not present

## 2020-01-09 DIAGNOSIS — F209 Schizophrenia, unspecified: Secondary | ICD-10-CM | POA: Diagnosis not present

## 2020-02-12 ENCOUNTER — Other Ambulatory Visit: Payer: Self-pay | Admitting: Family Medicine

## 2020-02-12 DIAGNOSIS — F209 Schizophrenia, unspecified: Secondary | ICD-10-CM | POA: Diagnosis not present

## 2020-03-11 ENCOUNTER — Other Ambulatory Visit: Payer: Self-pay

## 2020-03-13 ENCOUNTER — Ambulatory Visit: Payer: PPO | Admitting: Family Medicine

## 2020-03-25 ENCOUNTER — Ambulatory Visit: Payer: PPO | Admitting: Family Medicine

## 2020-03-25 ENCOUNTER — Telehealth: Payer: Self-pay | Admitting: Family Medicine

## 2020-03-25 NOTE — Telephone Encounter (Signed)
Left message for patient to call back and schedule Medicare Annual Wellness Visit (AWV)   This should be a virtual visit only=30 minutes.  No hx of AWV; please schedule at anytime with Denisa O'Brien-Blaney at [place]   

## 2020-03-29 ENCOUNTER — Other Ambulatory Visit: Payer: Self-pay | Admitting: Family Medicine

## 2020-04-08 DIAGNOSIS — F209 Schizophrenia, unspecified: Secondary | ICD-10-CM | POA: Diagnosis not present

## 2020-04-30 DIAGNOSIS — F209 Schizophrenia, unspecified: Secondary | ICD-10-CM | POA: Diagnosis not present

## 2020-05-07 ENCOUNTER — Other Ambulatory Visit: Payer: Self-pay

## 2020-05-07 ENCOUNTER — Encounter: Payer: Self-pay | Admitting: Family Medicine

## 2020-05-07 ENCOUNTER — Ambulatory Visit (INDEPENDENT_AMBULATORY_CARE_PROVIDER_SITE_OTHER): Payer: PPO | Admitting: Family Medicine

## 2020-05-07 DIAGNOSIS — E785 Hyperlipidemia, unspecified: Secondary | ICD-10-CM | POA: Diagnosis not present

## 2020-05-07 DIAGNOSIS — L989 Disorder of the skin and subcutaneous tissue, unspecified: Secondary | ICD-10-CM | POA: Diagnosis not present

## 2020-05-07 DIAGNOSIS — Z794 Long term (current) use of insulin: Secondary | ICD-10-CM | POA: Diagnosis not present

## 2020-05-07 DIAGNOSIS — E119 Type 2 diabetes mellitus without complications: Secondary | ICD-10-CM

## 2020-05-07 DIAGNOSIS — F209 Schizophrenia, unspecified: Secondary | ICD-10-CM | POA: Diagnosis not present

## 2020-05-07 LAB — HEMOGLOBIN A1C: Hgb A1c MFr Bld: 6.3 % (ref 4.6–6.5)

## 2020-05-07 LAB — COMPREHENSIVE METABOLIC PANEL
ALT: 17 U/L (ref 0–53)
AST: 17 U/L (ref 0–37)
Albumin: 4.5 g/dL (ref 3.5–5.2)
Alkaline Phosphatase: 53 U/L (ref 39–117)
BUN: 13 mg/dL (ref 6–23)
CO2: 29 mEq/L (ref 19–32)
Calcium: 9.7 mg/dL (ref 8.4–10.5)
Chloride: 104 mEq/L (ref 96–112)
Creatinine, Ser: 0.82 mg/dL (ref 0.40–1.50)
GFR: 102.82 mL/min (ref >60.00)
Glucose, Bld: 128 mg/dL — ABNORMAL HIGH (ref 70–99)
Potassium: 4.5 mEq/L (ref 3.5–5.1)
Sodium: 139 mEq/L (ref 135–145)
Total Bilirubin: 0.7 mg/dL (ref 0.2–1.2)
Total Protein: 6.8 g/dL (ref 6.0–8.3)

## 2020-05-07 LAB — LDL CHOLESTEROL, DIRECT: Direct LDL: 83 mg/dL

## 2020-05-07 MED ORDER — NYSTATIN 100000 UNIT/GM EX CREA: 1.0000 "application " | TOPICAL_CREAM | Freq: Two times a day (BID) | CUTANEOUS | 0 refills | Status: DC

## 2020-05-07 NOTE — Patient Instructions (Signed)
Nice to see you. We will contact you with your lab results and then determine if you are going to change Lantus to something different. The eye doctor will give you call to schedule an appointment. I will see you back in 3 months.

## 2020-05-07 NOTE — Assessment & Plan Note (Signed)
Nystatin previously beneficial.  Could be related to candidal intertrigo though could also just be related to skin irritation from where his pants rub.  We will trial nystatin again.

## 2020-05-07 NOTE — Assessment & Plan Note (Signed)
This has been very well controlled.  I discussed potentially switching him from Lantus to an oral medicine such as Jardiance.  He will have an A1c today and then will consider making this change.  He will continue Lantus 18 units once daily and Metformin 500 mg twice daily at this time.

## 2020-05-07 NOTE — Progress Notes (Signed)
Tommi Rumps, MD Phone: [phone]  Bobby Clements is a 50 y.o. male who presents today for f/u.  DIABETES Disease Monitoring: Blood Sugar ranges-mostly <140 Polyuria/phagia/dipsia- no      Optho- due Medications: Compliance- taking lantus 18 u daily, metformin Hypoglycemic symptoms- no  HYPERLIPIDEMIA Symptoms Chest pain on exertion:  no   Leg claudication:   no Medications: Compliance- taking crestor Right upper quadrant pain- no  Muscle aches- no  Inguinal skin rash: This is been an ongoing intermittent issue.  He gets irritated and rubs.  No purulent drainage.  He notes nystatin was beneficial though Neosporin is also beneficial.  He keeps the area covered with Band-Aids.     Social History   Tobacco Use  Smoking Status Former Smoker  . Packs/day: 1.00  Smokeless Tobacco Former Systems developer  . Quit date: 12/14/2016    Current Outpatient Medications on File Prior to Visit  Medication Sig Dispense Refill  . cloZAPine (CLOZARIL) 100 MG tablet Take 100 mg by mouth 2 (two) times daily. Take one tablet in the morning and two tablets at night    . FLUoxetine (PROZAC) 40 MG capsule Take 40 mg by mouth at bedtime.     . Insulin Pen Needle (ULTICARE MINI PEN NEEDLES) 31G X 6 MM MISC USE AS INSTRUCTED TO INJECT INSULIN ONCEA DAY 100 each 4  . LANTUS SOLOSTAR 100 UNIT/ML Solostar Pen INJECT 22 UNITS SUBCUTANEOUSLY DAILY 15 mL 1  . metFORMIN (GLUCOPHAGE) 500 MG tablet TAKE 1 TABLET BY MOUTH TWICE A DAY WITH A MEAL 180 tablet 0  . rosuvastatin (CRESTOR) 20 MG tablet TAKE 1 TABLET BY MOUTH DAILY. 90 tablet 3   No current facility-administered medications on file prior to visit.     ROS see history of present illness  Objective  Physical Exam Vitals:   05/07/20 0943  BP: 110/70  Pulse: (!) 107  Temp: 98.4 F (36.9 C)  SpO2: 97%    BP Readings from Last 3 Encounters:  05/07/20 110/70  12/13/19 110/70  04/18/18 118/72   Wt Readings from Last 3 Encounters:  05/07/20  239 lb (108.4 kg)  12/13/19 227 lb 9.6 oz (103.2 kg)  04/18/18 236 lb 6.4 oz (107.2 kg)    Physical Exam Constitutional:      General: He is not in acute distress.    Appearance: He is not diaphoretic.  Cardiovascular:     Rate and Rhythm: Normal rate and regular rhythm.     Heart sounds: Normal heart sounds.  Pulmonary:     Effort: Pulmonary effort is normal.     Breath sounds: Normal breath sounds.  Musculoskeletal:        General: No edema.  Skin:    General: Skin is warm and dry.       Neurological:     Mental Status: He is alert.      Assessment/Plan: Please see individual problem list.  Problem List Items Addressed This Visit    DM (diabetes mellitus), type 2 ([place])    This has been very well controlled.  I discussed potentially switching him from Lantus to an oral medicine such as Jardiance.  He will have an A1c today and then will consider making this change.  He will continue Lantus 18 units once daily and Metformin 500 mg twice daily at this time.      Relevant Orders   HgB A1c   Ambulatory referral to Ophthalmology   HLD (hyperlipidemia)    Continue Crestor 20 mg  daily.  Check LDL.      Relevant Orders   Direct LDL   Comp Met (CMET)   Skin lesion    Nystatin previously beneficial.  Could be related to candidal intertrigo though could also just be related to skin irritation from where his pants rub.  We will trial nystatin again.      Relevant Medications   nystatin cream (MYCOSTATIN)      This visit occurred during the SARS-CoV-2 public health emergency.  Safety protocols were in place, including screening questions prior to the visit, additional usage of staff PPE, and extensive cleaning of exam room while observing appropriate contact time as indicated for disinfecting solutions.    Tommi Rumps, MD [place]

## 2020-05-07 NOTE — Assessment & Plan Note (Signed)
Continue Crestor 20 mg daily.  Check LDL.

## 2020-05-15 ENCOUNTER — Telehealth: Payer: Self-pay

## 2020-05-15 ENCOUNTER — Telehealth: Payer: Self-pay | Admitting: Family Medicine

## 2020-05-15 DIAGNOSIS — E7800 Pure hypercholesterolemia, unspecified: Secondary | ICD-10-CM

## 2020-05-15 DIAGNOSIS — Z794 Long term (current) use of insulin: Secondary | ICD-10-CM

## 2020-05-15 NOTE — Telephone Encounter (Signed)
-----   Message from Glori Luis, MD sent at 05/09/2020  9:46 AM EST ----- Please let the patient and his mother know his A1c is well controlled at 6.3.  I would like to start him on Jardiance 10 mg once daily and discontinue his Lantus.  He would need follow-up in about 6 weeks to see how he is doing with this change.  He would continue on the Metformin 500 mg twice daily.  His LDL is not at goal and I would like to increase his Crestor to 40 mg once daily.  He would need labs at that 6 weeks follow-up as well.

## 2020-05-15 NOTE — Telephone Encounter (Signed)
Pt mom called to get results please call [phone]

## 2020-05-15 NOTE — Telephone Encounter (Signed)
LVM for the patient to call back and ask for Mathius Birkeland for lab results.  Carron Mcmurry,cma

## 2020-05-16 NOTE — Telephone Encounter (Signed)
I spoke with the patient's mother and informed her of patient's lab results and she understood.  Qasim Diveley,cma

## 2020-05-20 MED ORDER — EMPAGLIFLOZIN 10 MG PO TABS
10.0000 mg | ORAL_TABLET | Freq: Every day | ORAL | 2 refills | Status: DC
Start: 2020-05-20 — End: 2020-08-06

## 2020-05-20 NOTE — Addendum Note (Signed)
Addended by: Glori Luis on: 05/20/2020 08:09 AM   Modules accepted: Orders

## 2020-05-20 NOTE — Telephone Encounter (Signed)
Called and LVM for the patients mother to return a call back.  Selita Staiger,cma

## 2020-05-20 NOTE — Telephone Encounter (Signed)
Jardiance sent to pharmacy.  He should start this the day after he takes his last dose of Lantus.  If he develops any illnesses with vomiting or diarrhea or poor oral intake while on the Jardiance he needs to stop the Jardiance and then he can resume it once those symptoms resolved.  If he notices any symptoms of a UTI or genital yeast infection he should let us know as well.  If his sugars start to run higher with making this change they should let us know sooner than his next scheduled appointment as well.  Thanks.

## 2020-05-22 ENCOUNTER — Other Ambulatory Visit: Payer: Self-pay

## 2020-05-22 ENCOUNTER — Telehealth: Payer: Self-pay

## 2020-05-22 DIAGNOSIS — E785 Hyperlipidemia, unspecified: Secondary | ICD-10-CM

## 2020-05-22 MED ORDER — ROSUVASTATIN CALCIUM 40 MG PO TABS: 40.0000 mg | ORAL_TABLET | Freq: Every day | ORAL | 1 refills | Status: DC

## 2020-05-23 NOTE — Telephone Encounter (Signed)
I called and spoke with the patients mother and informed her of the side affects of the Jardiance and that it was sent to the pharmacy and she understood.  Joneisha Miles,cma

## 2020-06-03 ENCOUNTER — Telehealth: Payer: Self-pay | Admitting: Family Medicine

## 2020-06-03 NOTE — Telephone Encounter (Signed)
Left message for patient to call back and schedule Medicare Annual Wellness Visit (AWV)   This should be a virtual visit only=30 minutes.  No hx of AWV; please schedule at anytime with Denisa O'Brien-Blaney at [place]   

## 2020-06-10 DIAGNOSIS — F209 Schizophrenia, unspecified: Secondary | ICD-10-CM | POA: Diagnosis not present

## 2020-07-01 ENCOUNTER — Other Ambulatory Visit (INDEPENDENT_AMBULATORY_CARE_PROVIDER_SITE_OTHER): Payer: PPO

## 2020-07-01 ENCOUNTER — Other Ambulatory Visit: Payer: Self-pay

## 2020-07-01 DIAGNOSIS — E7800 Pure hypercholesterolemia, unspecified: Secondary | ICD-10-CM

## 2020-07-01 DIAGNOSIS — E119 Type 2 diabetes mellitus without complications: Secondary | ICD-10-CM

## 2020-07-01 DIAGNOSIS — Z794 Long term (current) use of insulin: Secondary | ICD-10-CM

## 2020-07-01 LAB — COMPREHENSIVE METABOLIC PANEL
ALT: 15 U/L (ref 0–53)
AST: 13 U/L (ref 0–37)
Albumin: 4.3 g/dL (ref 3.5–5.2)
Alkaline Phosphatase: 50 U/L (ref 39–117)
BUN: 13 mg/dL (ref 6–23)
CO2: 28 mEq/L (ref 19–32)
Calcium: 9.4 mg/dL (ref 8.4–10.5)
Chloride: 105 mEq/L (ref 96–112)
Creatinine, Ser: 0.77 mg/dL (ref 0.40–1.50)
GFR: 104.68 mL/min (ref >60.00)
Glucose, Bld: 130 mg/dL — ABNORMAL HIGH (ref 70–99)
Potassium: 4.3 mEq/L (ref 3.5–5.1)
Sodium: 140 mEq/L (ref 135–145)
Total Bilirubin: 0.6 mg/dL (ref 0.2–1.2)
Total Protein: 6.6 g/dL (ref 6.0–8.3)

## 2020-07-01 LAB — LDL CHOLESTEROL, DIRECT: Direct LDL: 64 mg/dL

## 2020-07-09 DIAGNOSIS — F209 Schizophrenia, unspecified: Secondary | ICD-10-CM | POA: Diagnosis not present

## 2020-07-22 ENCOUNTER — Other Ambulatory Visit: Payer: Self-pay | Admitting: Family Medicine

## 2020-08-05 ENCOUNTER — Ambulatory Visit: Payer: PPO | Admitting: Family Medicine

## 2020-08-05 DIAGNOSIS — F209 Schizophrenia, unspecified: Secondary | ICD-10-CM | POA: Diagnosis not present

## 2020-08-06 ENCOUNTER — Other Ambulatory Visit: Payer: Self-pay | Admitting: Family Medicine

## 2020-08-06 NOTE — Telephone Encounter (Signed)
Error. ng 

## 2020-08-08 ENCOUNTER — Ambulatory Visit: Payer: PPO | Admitting: Family Medicine

## 2020-08-20 DIAGNOSIS — F209 Schizophrenia, unspecified: Secondary | ICD-10-CM | POA: Diagnosis not present

## 2020-08-20 DIAGNOSIS — Z79899 Other long term (current) drug therapy: Secondary | ICD-10-CM | POA: Diagnosis not present

## 2020-08-20 DIAGNOSIS — F419 Anxiety disorder, unspecified: Secondary | ICD-10-CM | POA: Diagnosis not present

## 2020-09-09 DIAGNOSIS — F209 Schizophrenia, unspecified: Secondary | ICD-10-CM | POA: Diagnosis not present

## 2020-09-10 ENCOUNTER — Ambulatory Visit (INDEPENDENT_AMBULATORY_CARE_PROVIDER_SITE_OTHER): Payer: PPO | Admitting: Family Medicine

## 2020-09-10 ENCOUNTER — Encounter: Payer: Self-pay | Admitting: Family Medicine

## 2020-09-10 ENCOUNTER — Other Ambulatory Visit: Payer: Self-pay

## 2020-09-10 VITALS — BP 128/70 | HR 88 | Temp 98.3°F | Ht 65.0 in | Wt 227.8 lb

## 2020-09-10 DIAGNOSIS — E119 Type 2 diabetes mellitus without complications: Secondary | ICD-10-CM

## 2020-09-10 DIAGNOSIS — F200 Paranoid schizophrenia: Secondary | ICD-10-CM

## 2020-09-10 DIAGNOSIS — E785 Hyperlipidemia, unspecified: Secondary | ICD-10-CM | POA: Diagnosis not present

## 2020-09-10 DIAGNOSIS — Z794 Long term (current) use of insulin: Secondary | ICD-10-CM | POA: Diagnosis not present

## 2020-09-10 LAB — POCT GLYCOSYLATED HEMOGLOBIN (HGB A1C): Hemoglobin A1C: 6.2 % — AB (ref 4.0–5.6)

## 2020-09-10 NOTE — Patient Instructions (Signed)
Nice to see you. Your A1c is well controlled.  You will continue on Jardiance and metformin. Please consider getting your tetanus vaccine at the pharmacy or the health department. I would recommend colon cancer screening with a colonoscopy.  Another option would be Cologuard which is a stool test.  If you would like to do either of those please let me know.

## 2020-09-10 NOTE — Assessment & Plan Note (Signed)
Chronic issue.  Patient reports this is stable.  He will continue to see psychiatry.

## 2020-09-10 NOTE — Assessment & Plan Note (Signed)
Remains well controlled.  He will continue Jardiance 10 mg once daily and metformin 500 mg twice daily.  Follow-up in 3 months.

## 2020-09-10 NOTE — Assessment & Plan Note (Signed)
Continue Crestor.  This is well controlled.

## 2020-09-10 NOTE — Progress Notes (Signed)
Bobby Alar, MD Phone: [phone]  Bobby Clements is a 50 y.o. male who presents today for f/u.  DIABETES Disease Monitoring: Blood Sugar ranges-112-143 Polyuria/phagia/dipsia- no      Optho- referral previously placed Medications: Compliance- taking jardiance 10 mg daily and metformin 500 mg BID Hypoglycemic symptoms- no  HYPERLIPIDEMIA Symptoms Chest pain on exertion:  no   Leg claudication:   no Medications: Compliance- taking crestor Right upper quadrant pain- no  Muscle aches- no LDL well controlled on last check.   Paranoid schizophrenia: Patient notes this is well controlled.  He notes no auditory or visual hallucinations.  He remains on clozapine and Prozac.  He continues to follow with psychiatry.    Social History   Tobacco Use  Smoking Status Former   Packs/day: 1.00   Pack years: 0.00   Types: Cigarettes  Smokeless Tobacco Former   Quit date: 12/14/2016    Current Outpatient Medications on File Prior to Visit  Medication Sig Dispense Refill   cloZAPine (CLOZARIL) 100 MG tablet Take 100 mg by mouth 2 (two) times daily. Take one tablet in the morning and two tablets at night     FLUoxetine (PROZAC) 40 MG capsule Take 40 mg by mouth at bedtime.      JARDIANCE 10 MG TABS tablet TAKE 1 TABLET BY MOUTH DAILY BEFORE BREAKFAST 30 tablet 2   metFORMIN (GLUCOPHAGE) 500 MG tablet TAKE 1 TABLET BY MOUTH TWICE A DAY WITH A MEAL 180 tablet 0   nystatin cream (MYCOSTATIN) Apply 1 application topically 2 (two) times daily. 30 g 0   rosuvastatin (CRESTOR) 40 MG tablet Take 1 tablet (40 mg total) by mouth daily. 90 tablet 1   Insulin Pen Needle (ULTICARE MINI PEN NEEDLES) 31G X 6 MM MISC USE AS INSTRUCTED TO INJECT INSULIN ONCEA DAY (Patient not taking: Reported on 09/10/2020) 100 each 4   No current facility-administered medications on file prior to visit.     ROS see history of present illness  Objective  Physical Exam Vitals:   09/10/20 0821  BP: 128/70   Pulse: 88  Temp: 98.3 F (36.8 C)  SpO2: 97%    BP Readings from Last 3 Encounters:  09/10/20 128/70  05/07/20 110/70  12/13/19 110/70   Wt Readings from Last 3 Encounters:  09/10/20 227 lb 12.8 oz (103.3 kg)  05/07/20 239 lb (108.4 kg)  12/13/19 227 lb 9.6 oz (103.2 kg)    Physical Exam Constitutional:      General: He is not in acute distress.    Appearance: He is not diaphoretic.  Cardiovascular:     Rate and Rhythm: Normal rate and regular rhythm.     Heart sounds: Normal heart sounds.  Pulmonary:     Effort: Pulmonary effort is normal.     Breath sounds: Normal breath sounds.  Skin:    General: Skin is warm and dry.  Neurological:     Mental Status: He is alert.     Assessment/Plan: Please see individual problem list.  Problem List Items Addressed This Visit     DM (diabetes mellitus), type 2 (HCC)    Remains well controlled.  He will continue Jardiance 10 mg once daily and metformin 500 mg twice daily.  Follow-up in 3 months.       Relevant Orders   POCT HgB A1C (Completed)   HLD (hyperlipidemia) - Primary    Continue Crestor.  This is well controlled.       Paranoid schizophrenia (HCC)  Chronic issue.  Patient reports this is stable.  He will continue to see psychiatry.         Health Maintenance: I encouraged the patient to get his tetanus vaccine at the health department or pharmacy.  We also discussed colon cancer screening.  He is hesitant to do this though I have asked him to discuss this with his parents and to let me know if he changes his mind.  He does have social determinants of health that do make the discussion on health maintenance topics and other medical issues somewhat difficult. Noted. Return in about 3 months (around 12/11/2020) for Diabetes.  This visit occurred during the SARS-CoV-2 public health emergency.  Safety protocols were in place, including screening questions prior to the visit, additional usage of staff PPE, and  extensive cleaning of exam room while observing appropriate contact time as indicated for disinfecting solutions.    Bobby Alar, MD [facility]

## 2020-09-23 ENCOUNTER — Other Ambulatory Visit: Payer: Self-pay | Admitting: Family Medicine

## 2020-10-07 DIAGNOSIS — E119 Type 2 diabetes mellitus without complications: Secondary | ICD-10-CM | POA: Diagnosis not present

## 2020-10-07 LAB — HM DIABETES EYE EXAM

## 2020-10-08 DIAGNOSIS — F209 Schizophrenia, unspecified: Secondary | ICD-10-CM | POA: Diagnosis not present

## 2020-10-11 ENCOUNTER — Encounter: Payer: Self-pay | Admitting: Family Medicine

## 2020-10-25 ENCOUNTER — Other Ambulatory Visit: Payer: Self-pay | Admitting: Family Medicine

## 2020-11-27 DIAGNOSIS — F209 Schizophrenia, unspecified: Secondary | ICD-10-CM | POA: Diagnosis not present

## 2020-12-02 ENCOUNTER — Other Ambulatory Visit: Payer: Self-pay | Admitting: Family Medicine

## 2020-12-02 DIAGNOSIS — E785 Hyperlipidemia, unspecified: Secondary | ICD-10-CM

## 2020-12-10 DIAGNOSIS — F419 Anxiety disorder, unspecified: Secondary | ICD-10-CM | POA: Diagnosis not present

## 2020-12-10 DIAGNOSIS — F209 Schizophrenia, unspecified: Secondary | ICD-10-CM | POA: Diagnosis not present

## 2020-12-11 ENCOUNTER — Telehealth: Payer: Self-pay

## 2020-12-11 ENCOUNTER — Encounter: Payer: Self-pay | Admitting: Family Medicine

## 2020-12-11 ENCOUNTER — Other Ambulatory Visit: Payer: Self-pay

## 2020-12-11 ENCOUNTER — Ambulatory Visit (INDEPENDENT_AMBULATORY_CARE_PROVIDER_SITE_OTHER): Payer: PPO | Admitting: Family Medicine

## 2020-12-11 VITALS — BP 120/80 | HR 83 | Temp 98.5°F | Ht 65.0 in | Wt 221.6 lb

## 2020-12-11 DIAGNOSIS — E119 Type 2 diabetes mellitus without complications: Secondary | ICD-10-CM | POA: Diagnosis not present

## 2020-12-11 DIAGNOSIS — Z23 Encounter for immunization: Secondary | ICD-10-CM | POA: Diagnosis not present

## 2020-12-11 DIAGNOSIS — Z794 Long term (current) use of insulin: Secondary | ICD-10-CM | POA: Diagnosis not present

## 2020-12-11 DIAGNOSIS — E785 Hyperlipidemia, unspecified: Secondary | ICD-10-CM

## 2020-12-11 LAB — COMPREHENSIVE METABOLIC PANEL
ALT: 17 U/L (ref 0–53)
AST: 15 U/L (ref 0–37)
Albumin: 4.5 g/dL (ref 3.5–5.2)
Alkaline Phosphatase: 53 U/L (ref 39–117)
BUN: 12 mg/dL (ref 6–23)
CO2: 26 mEq/L (ref 19–32)
Calcium: 9.6 mg/dL (ref 8.4–10.5)
Chloride: 106 mEq/L (ref 96–112)
Creatinine, Ser: 0.69 mg/dL (ref 0.40–1.50)
GFR: 107.87 mL/min (ref >60.00)
Glucose, Bld: 113 mg/dL — ABNORMAL HIGH (ref 70–99)
Potassium: 4.2 mEq/L (ref 3.5–5.1)
Sodium: 140 mEq/L (ref 135–145)
Total Bilirubin: 0.7 mg/dL (ref 0.2–1.2)
Total Protein: 6.7 g/dL (ref 6.0–8.3)

## 2020-12-11 LAB — LIPID PANEL
Cholesterol: 123 mg/dL (ref 0–200)
HDL: 46.2 mg/dL (ref >39.00)
LDL Cholesterol: 47 mg/dL (ref 0–99)
NonHDL: 77
Total CHOL/HDL Ratio: 3
Triglycerides: 152 mg/dL — ABNORMAL HIGH (ref 0.0–149.0)
VLDL: 30.4 mg/dL (ref 0.0–40.0)

## 2020-12-11 LAB — POCT GLYCOSYLATED HEMOGLOBIN (HGB A1C): Hemoglobin A1C: 6.2 % — AB (ref 4.0–5.6)

## 2020-12-11 NOTE — Assessment & Plan Note (Addendum)
Well-controlled.  A1c at goal.  Continue Jardiance 10 mg once daily and metformin 500 mg twice daily.

## 2020-12-11 NOTE — Progress Notes (Signed)
Bobby Rumps, MD Phone: [phone]  Bobby Clements is a 50 y.o. male who presents today for f/u.  DIABETES Disease Monitoring: Blood Sugar ranges-mostly <140 Polyuria/phagia/dipsia- no      Optho- UTD Medications: Compliance- taking jardiance, metformin Hypoglycemic symptoms- no  HYPERLIPIDEMIA Symptoms Chest pain on exertion:  no   Leg claudication:   no Medications: Compliance- taking crestor Right upper quadrant pain- no  Muscle aches- no Lipid Panel     Component Value Date/Time   CHOL 129 12/13/2019 1132   TRIG 153.0 (H) 12/13/2019 1132   HDL 44.70 12/13/2019 1132   CHOLHDL 3 12/13/2019 1132   VLDL 30.6 12/13/2019 1132   LDLCALC 54 12/13/2019 1132   LDLDIRECT 64.0 07/01/2020 0839      Social History   Tobacco Use  Smoking Status Former   Packs/day: 1.00   Types: Cigarettes  Smokeless Tobacco Former   Quit date: 12/14/2016    Current Outpatient Medications on File Prior to Visit  Medication Sig Dispense Refill   cloZAPine (CLOZARIL) 100 MG tablet Take 100 mg by mouth 2 (two) times daily. Take one tablet in the morning and two tablets at night     FLUoxetine (PROZAC) 40 MG capsule Take 40 mg by mouth at bedtime.      Insulin Pen Needle (ULTICARE MINI PEN NEEDLES) 31G X 6 MM MISC USE AS INSTRUCTED TO INJECT INSULIN ONCEA DAY 100 each 4   JARDIANCE 10 MG TABS tablet TAKE 1 TABLET BY MOUTH DAILY BEFORE BREAKFAST 30 tablet 2   metFORMIN (GLUCOPHAGE) 500 MG tablet TAKE 1 TABLET BY MOUTH TWICE A DAY WITH A MEAL 180 tablet 0   nystatin cream (MYCOSTATIN) Apply 1 application topically 2 (two) times daily. 30 g 0   rosuvastatin (CRESTOR) 40 MG tablet TAKE 1 TABLET BY MOUTH DAILY 90 tablet 1   No current facility-administered medications on file prior to visit.     ROS see history of present illness  Objective  Physical Exam Vitals:   12/11/20 1006  BP: 120/80  Pulse: 83  Temp: 98.5 F (36.9 C)  SpO2: 98%    BP Readings from Last 3 Encounters:   12/11/20 120/80  09/10/20 128/70  05/07/20 110/70   Wt Readings from Last 3 Encounters:  12/11/20 221 lb 9.6 oz (100.5 kg)  09/10/20 227 lb 12.8 oz (103.3 kg)  05/07/20 239 lb (108.4 kg)    Physical Exam Constitutional:      General: He is not in acute distress.    Appearance: He is not diaphoretic.  Cardiovascular:     Rate and Rhythm: Normal rate and regular rhythm.     Heart sounds: Normal heart sounds.  Pulmonary:     Effort: Pulmonary effort is normal.     Breath sounds: Normal breath sounds.  Musculoskeletal:     Right lower leg: No edema.     Left lower leg: No edema.  Skin:    General: Skin is warm and dry.  Neurological:     Mental Status: He is alert.     Assessment/Plan: Please see individual problem list.  Problem List Items Addressed This Visit     DM (diabetes mellitus), type 2 ([place])    Check A1c.  Continue Jardiance 10 mg once daily and metformin 500 mg twice daily.      Relevant Orders   POCT HgB A1C (Completed)   HLD (hyperlipidemia)    Check lipid panel and CMP.  Continue Crestor 40 mg daily.  Relevant Orders   Lipid panel   Comp Met (CMET)   Other Visit Diagnoses     Need for immunization against influenza    -  Primary   Relevant Orders   Flu Vaccine QUAD 49moIM (Fluarix, Fluzone & Alfiuria Quad PF) (Completed)        Health Maintenance: Encouraged the patient to get the tetanus vaccine at a local pharmacy.  Encouraged him to get the Shingrix vaccine at a local pharmacy as well.  Return in about 6 months (around 06/10/2021) for Diabetes/hyperlipidemia.  This visit occurred during the SARS-CoV-2 public health emergency.  Safety protocols were in place, including screening questions prior to the visit, additional usage of staff PPE, and extensive cleaning of exam room while observing appropriate contact time as indicated for disinfecting solutions.    ETommi Rumps MD [place]

## 2020-12-11 NOTE — Assessment & Plan Note (Signed)
Check lipid panel and CMP.  Continue Crestor 40 mg daily.

## 2020-12-11 NOTE — Patient Instructions (Addendum)
Nice to see you. We will check your cholesterol today. Please check into getting the tetanus vaccine at a [facility] or [facility].  You can also check into getting the Shingrix vaccine for shingles there as well. You will continue your current dose of Jardiance and metformin.

## 2020-12-26 DIAGNOSIS — F209 Schizophrenia, unspecified: Secondary | ICD-10-CM | POA: Diagnosis not present

## 2020-12-30 ENCOUNTER — Other Ambulatory Visit: Payer: Self-pay | Admitting: Family Medicine

## 2021-01-24 ENCOUNTER — Other Ambulatory Visit: Payer: Self-pay | Admitting: Family Medicine

## 2021-02-10 DIAGNOSIS — F209 Schizophrenia, unspecified: Secondary | ICD-10-CM | POA: Diagnosis not present

## 2021-03-12 DIAGNOSIS — F209 Schizophrenia, unspecified: Secondary | ICD-10-CM | POA: Diagnosis not present

## 2021-03-24 ENCOUNTER — Other Ambulatory Visit: Payer: Self-pay | Admitting: Family Medicine

## 2021-04-07 DIAGNOSIS — F209 Schizophrenia, unspecified: Secondary | ICD-10-CM | POA: Diagnosis not present

## 2021-04-25 ENCOUNTER — Other Ambulatory Visit: Payer: Self-pay | Admitting: Family Medicine

## 2021-05-07 DIAGNOSIS — F209 Schizophrenia, unspecified: Secondary | ICD-10-CM | POA: Diagnosis not present

## 2021-05-26 DIAGNOSIS — F209 Schizophrenia, unspecified: Secondary | ICD-10-CM | POA: Diagnosis not present

## 2021-05-28 ENCOUNTER — Other Ambulatory Visit: Payer: Self-pay | Admitting: Family Medicine

## 2021-05-28 DIAGNOSIS — E785 Hyperlipidemia, unspecified: Secondary | ICD-10-CM

## 2021-06-10 ENCOUNTER — Encounter: Payer: Self-pay | Admitting: Family Medicine

## 2021-06-10 ENCOUNTER — Other Ambulatory Visit: Payer: Self-pay

## 2021-06-10 ENCOUNTER — Ambulatory Visit (INDEPENDENT_AMBULATORY_CARE_PROVIDER_SITE_OTHER): Payer: PPO | Admitting: Family Medicine

## 2021-06-10 VITALS — BP 120/80 | HR 105 | Temp 98.8°F | Ht 65.0 in | Wt 228.8 lb

## 2021-06-10 DIAGNOSIS — Z794 Long term (current) use of insulin: Secondary | ICD-10-CM | POA: Diagnosis not present

## 2021-06-10 DIAGNOSIS — N476 Balanoposthitis: Secondary | ICD-10-CM | POA: Diagnosis not present

## 2021-06-10 DIAGNOSIS — Z1211 Encounter for screening for malignant neoplasm of colon: Secondary | ICD-10-CM | POA: Diagnosis not present

## 2021-06-10 DIAGNOSIS — E785 Hyperlipidemia, unspecified: Secondary | ICD-10-CM | POA: Diagnosis not present

## 2021-06-10 DIAGNOSIS — E119 Type 2 diabetes mellitus without complications: Secondary | ICD-10-CM

## 2021-06-10 LAB — MICROALBUMIN / CREATININE URINE RATIO
Creatinine,U: 53.7 mg/dL
Microalb Creat Ratio: 1.3 mg/g (ref 0.0–30.0)
Microalb, Ur: <0.7 mg/dL (ref 0.0–1.9)

## 2021-06-10 LAB — POCT GLYCOSYLATED HEMOGLOBIN (HGB A1C): Hemoglobin A1C: 6.2 % — AB (ref 4.0–5.6)

## 2021-06-10 MED ORDER — CLOTRIMAZOLE 1 % EX CREA: 1.0000 "application " | TOPICAL_CREAM | Freq: Two times a day (BID) | CUTANEOUS | 0 refills | Status: DC

## 2021-06-10 NOTE — Patient Instructions (Addendum)
Nice to see you. ?Please use the topical clotrimazole on the area of your penis that is irritated.  We will recheck this in 3 weeks. ?I believe the Jardiance may be contributing to this.  Please discontinue that as this will help the area improve. ?I ordered the Cologuard as we discussed for colon cancer screening.  Colonoscopy is the optimal mode of screening though you did not want to proceed with this.  The Cologuard is the next best option.  It is a stool test that will be mailed to you.  If you decide you do not want to do this do not mail it back.  If it is positive you will have to have a colonoscopy and your insurance may not pay for that colonoscopy given that it will no longer be a screening test.  If you change your mind on wanting the colonoscopy please let me know. ?

## 2021-06-10 NOTE — Progress Notes (Signed)
?Marikay Alar, MD ?Phone: [phone] ? ?Bobby Clements is a 51 y.o. male who presents today for f/u. ? ?DIABETES ?Disease Monitoring: ?Blood Sugar ranges-110s-140 Polyuria/phagia/dipsia- no      Optho- UTD ?Medications: ?Compliance- taking jardiance, metformin Hypoglycemic symptoms- no ? ?HYPERLIPIDEMIA ?Symptoms ?Chest pain on exertion:  no   Leg claudication:   no ?Medications: ?Compliance- taking crestor Right upper quadrant pain- no  Muscle aches- no ?Lipid Panel  ?   ?Component Value Date/Time  ? CHOL 123 12/11/2020 1026  ? TRIG 152.0 (H) 12/11/2020 1026  ? HDL 46.20 12/11/2020 1026  ? CHOLHDL 3 12/11/2020 1026  ? VLDL 30.4 12/11/2020 1026  ? LDLCALC 47 12/11/2020 1026  ? LDLDIRECT 64.0 07/01/2020 0839  ? ?Penis irritation: Patient reports for the last 2 months he has had a red spot on the dorsum of his penis.  He notes it is underneath his foreskin.  He notes there is no pain.  He has been putting some over-the-counter topical ointment on it with no benefit.  He is not sexually active. ? ? ?Social History  ? ?Tobacco Use  ?Smoking Status Former  ? Packs/day: 1.00  ? Types: Cigarettes  ?Smokeless Tobacco Former  ? Quit date: 12/14/2016  ? ? ?Current Outpatient Medications on File Prior to Visit  ?Medication Sig Dispense Refill  ? cloZAPine (CLOZARIL) 100 MG tablet Take 100 mg by mouth 2 (two) times daily. Take one tablet in the morning and two tablets at night    ? FLUoxetine (PROZAC) 40 MG capsule Take 40 mg by mouth at bedtime.     ? Insulin Pen Needle (ULTICARE MINI PEN NEEDLES) 31G X 6 MM MISC USE AS INSTRUCTED TO INJECT INSULIN ONCEA DAY 100 each 4  ? metFORMIN (GLUCOPHAGE) 500 MG tablet TAKE 1 TABLET BY MOUTH TWICE A DAY WITH A MEAL 180 tablet 2  ? nystatin cream (MYCOSTATIN) Apply 1 application topically 2 (two) times daily. 30 g 0  ? rosuvastatin (CRESTOR) 40 MG tablet TAKE 1 TABLET BY MOUTH DAILY 90 tablet 1  ? ?No current facility-administered medications on file prior to visit.  ? ? ? ?ROS see  history of present illness ? ?Objective ? ?Physical Exam ?Vitals:  ? 06/10/21 0825  ?BP: 120/80  ?Pulse: (!) 105  ?Temp: 98.8 ?F (37.1 ?C)  ?SpO2: 96%  ? ? ?BP Readings from Last 3 Encounters:  ?06/10/21 120/80  ?12/11/20 120/80  ?09/10/20 128/70  ? ?Wt Readings from Last 3 Encounters:  ?06/10/21 228 lb 12.8 oz (103.8 kg)  ?12/11/20 221 lb 9.6 oz (100.5 kg)  ?09/10/20 227 lb 12.8 oz (103.3 kg)  ? ? ?Physical Exam ?Constitutional:   ?   General: He is not in acute distress. ?   Appearance: He is not diaphoretic.  ?Cardiovascular:  ?   Rate and Rhythm: Normal rate and regular rhythm.  ?   Heart sounds: Normal heart sounds.  ?Pulmonary:  ?   Effort: Pulmonary effort is normal.  ?   Breath sounds: Normal breath sounds.  ?Genitourinary: ?   Comments: Erythematous moist area on the dorsum of his penis on the inner portion of his foreskin, small amount of erythema extending onto the glans dorsally ?Skin: ?   General: Skin is warm and dry.  ?Neurological:  ?   Mental Status: He is alert.  ? ? ? ?Assessment/Plan: Please see individual problem list. ? ?Problem List Items Addressed This Visit   ? ? DM (diabetes mellitus), type 2 (HCC) (Chronic)  ?  Seems to be adequately controlled.  He will continue metformin 500 mg twice daily.  Given the likely balanoposthitis we will discontinue the Jardiance.  A1c well controlled today. ?  ?  ? Relevant Orders  ? POCT HgB A1C (Completed)  ? Urine Microalbumin w/creat. ratio  ? HLD (hyperlipidemia) (Chronic)  ?  Continue Crestor 40 mg once daily. ?  ?  ? Balanoposthitis  ?  I suspect this is related to a candidal cause given that he is on Jardiance.  We will discontinue Jardiance.  We will treat with topical clotrimazole.  He will follow-up in 3 weeks. ?  ?  ? ?Other Visit Diagnoses   ? ? Colon cancer screening    -  Primary  ? Relevant Orders  ? Cologuard  ? ?  ? ? ? ?Health Maintenance: Cologuard ordered.  Discussed colon cancer screening with colonoscopy would be optimal.  Discussed  that if the Cologuard was positive he would need to have a colonoscopy and that his insurance may not pay for that colonoscopy given that it would no longer be a screening test. This information was included in his AVS and he was advised to discuss with his parents as well. Discussed that if he decided he did not want to do the cologuard he should not send it back in the mail.  ? ?Return in about 3 weeks (around 07/01/2021) for Recheck skin irritation, 14-month follow-up with PCP as well. ? ?This visit occurred during the SARS-CoV-2 public health emergency.  Safety protocols were in place, including screening questions prior to the visit, additional usage of staff PPE, and extensive cleaning of exam room while observing appropriate contact time as indicated for disinfecting solutions.  ? ? ?Marikay Alar, MD ?[facility] [place] ? ?

## 2021-06-10 NOTE — Assessment & Plan Note (Signed)
Seems to be adequately controlled.  He will continue metformin 500 mg twice daily.  Given the likely balanoposthitis we will discontinue the Jardiance.  A1c well controlled today. ?

## 2021-06-10 NOTE — Assessment & Plan Note (Signed)
I suspect this is related to a candidal cause given that he is on Jardiance.  We will discontinue Jardiance.  We will treat with topical clotrimazole.  He will follow-up in 3 weeks. ?

## 2021-06-10 NOTE — Assessment & Plan Note (Signed)
Continue Crestor 40 mg once daily. 

## 2021-06-12 DIAGNOSIS — F209 Schizophrenia, unspecified: Secondary | ICD-10-CM | POA: Diagnosis not present

## 2021-07-04 ENCOUNTER — Ambulatory Visit: Payer: PPO | Admitting: Family Medicine

## 2021-07-08 ENCOUNTER — Ambulatory Visit (INDEPENDENT_AMBULATORY_CARE_PROVIDER_SITE_OTHER): Payer: PPO | Admitting: Family Medicine

## 2021-07-08 ENCOUNTER — Encounter: Payer: Self-pay | Admitting: Family Medicine

## 2021-07-08 VITALS — BP 120/78 | HR 90 | Temp 98.3°F | Ht 65.0 in | Wt 228.2 lb

## 2021-07-08 DIAGNOSIS — N476 Balanoposthitis: Secondary | ICD-10-CM | POA: Diagnosis not present

## 2021-07-08 DIAGNOSIS — Z794 Long term (current) use of insulin: Secondary | ICD-10-CM | POA: Diagnosis not present

## 2021-07-08 DIAGNOSIS — E119 Type 2 diabetes mellitus without complications: Secondary | ICD-10-CM | POA: Diagnosis not present

## 2021-07-08 MED ORDER — HYDROCORTISONE 1 % EX OINT: 1.0000 "application " | TOPICAL_OINTMENT | Freq: Two times a day (BID) | CUTANEOUS | 0 refills | Status: AC

## 2021-07-08 NOTE — Assessment & Plan Note (Signed)
Stable.  He will continue metformin 500 mg twice daily. ?

## 2021-07-08 NOTE — Assessment & Plan Note (Signed)
This continues to be an issue.  We will trial hydrocortisone 1% apply twice daily to the area of concern.  He will do this for 7 days.  If it is improving he will let me know.  I am also referring him to urology. ?

## 2021-07-08 NOTE — Patient Instructions (Signed)
Nice to see you. ?Please try hydrocortisone twice daily applied to the area of your rash on your penis.  You will do this for 7 days.  If it is improving please let me know.  If its not improving you can discontinue the hydrocortisone and see urology as discussed.  ?

## 2021-07-08 NOTE — Progress Notes (Signed)
?  Bobby Rumps, MD ?Phone: [phone] ? ?Bobby Clements is a 51 y.o. male who presents today for follow-up. ? ?Balanoposthitis: No improvement with clotrimazole.  No itching.  No pain. ? ?Diabetes: Patient notes his sugars are similar since discontinuing the Jardiance.  He continues on metformin.  No polyuria or polydipsia.  No hypoglycemia. ? ?Social History  ? ?Tobacco Use  ?Smoking Status Former  ? Packs/day: 1.00  ? Types: Cigarettes  ?Smokeless Tobacco Former  ? Quit date: 12/14/2016  ? ? ?Current Outpatient Medications on File Prior to Visit  ?Medication Sig Dispense Refill  ? clotrimazole (LOTRIMIN) 1 % cream Apply 1 application. topically 2 (two) times daily. 30 g 0  ? cloZAPine (CLOZARIL) 100 MG tablet Take 100 mg by mouth 2 (two) times daily. Take one tablet in the morning and two tablets at night    ? FLUoxetine (PROZAC) 40 MG capsule Take 40 mg by mouth at bedtime.     ? Insulin Pen Needle (ULTICARE MINI PEN NEEDLES) 31G X 6 MM MISC USE AS INSTRUCTED TO INJECT INSULIN ONCEA DAY 100 each 4  ? metFORMIN (GLUCOPHAGE) 500 MG tablet TAKE 1 TABLET BY MOUTH TWICE A DAY WITH A MEAL 180 tablet 2  ? nystatin cream (MYCOSTATIN) Apply 1 application topically 2 (two) times daily. 30 g 0  ? rosuvastatin (CRESTOR) 40 MG tablet TAKE 1 TABLET BY MOUTH DAILY 90 tablet 1  ? ?No current facility-administered medications on file prior to visit.  ? ? ? ?ROS see history of present illness ? ?Objective ? ?Physical Exam ?Vitals:  ? 07/08/21 1145  ?BP: 120/78  ?Pulse: 90  ?Temp: 98.3 ?F (36.8 ?C)  ?SpO2: 98%  ? ? ?BP Readings from Last 3 Encounters:  ?07/08/21 120/78  ?06/10/21 120/80  ?12/11/20 120/80  ? ?Wt Readings from Last 3 Encounters:  ?07/08/21 228 lb 3.2 oz (103.5 kg)  ?06/10/21 228 lb 12.8 oz (103.8 kg)  ?12/11/20 221 lb 9.6 oz (100.5 kg)  ? ? ?Physical Exam ?Genitourinary: ?   Comments: Similar erythematous rash on the dorsal aspect of his glans and dorsal inner aspect of his foreskin ? ? ? ?Assessment/Plan:  Please see individual problem list. ? ?Problem List Items Addressed This Visit   ? ? Balanoposthitis - Primary (Chronic)  ?  This continues to be an issue.  We will trial hydrocortisone 1% apply twice daily to the area of concern.  He will do this for 7 days.  If it is improving he will let me know.  I am also referring him to urology. ? ?  ?  ? Relevant Medications  ? hydrocortisone 1 % ointment  ? Other Relevant Orders  ? Ambulatory referral to Urology  ? DM (diabetes mellitus), type 2 (HCC) (Chronic)  ?  Stable.  He will continue metformin 500 mg twice daily. ? ?  ?  ? ? ? ?Return in about 3 months (around 10/07/2021) for Diabetes. ? ?This visit occurred during the SARS-CoV-2 public health emergency.  Safety protocols were in place, including screening questions prior to the visit, additional usage of staff PPE, and extensive cleaning of exam room while observing appropriate contact time as indicated for disinfecting solutions.  ? ? ?Bobby Rumps, MD ?[place] ? ?

## 2021-07-23 ENCOUNTER — Encounter: Payer: Self-pay | Admitting: Family Medicine

## 2021-08-04 DIAGNOSIS — F209 Schizophrenia, unspecified: Secondary | ICD-10-CM | POA: Diagnosis not present

## 2021-08-06 ENCOUNTER — Ambulatory Visit: Payer: Self-pay | Admitting: Urology

## 2021-08-06 ENCOUNTER — Encounter: Payer: Self-pay | Admitting: Urology

## 2021-08-06 ENCOUNTER — Ambulatory Visit (INDEPENDENT_AMBULATORY_CARE_PROVIDER_SITE_OTHER): Payer: PPO | Admitting: Urology

## 2021-08-06 VITALS — BP 138/74 | HR 82 | Ht 65.0 in | Wt 230.0 lb

## 2021-08-06 DIAGNOSIS — N476 Balanoposthitis: Secondary | ICD-10-CM

## 2021-08-06 MED ORDER — NYSTATIN-TRIAMCINOLONE 100000-0.1 UNIT/GM-% EX OINT: 1.0000 "application " | TOPICAL_OINTMENT | Freq: Two times a day (BID) | CUTANEOUS | 0 refills | Status: AC

## 2021-08-06 NOTE — Progress Notes (Signed)
   08/06/2021 8:32 AM   Bobby Clements May 11, 1970 [id number]  Referring provider: Glori Luis, MD [address] [place],  [place] [address]  Chief Complaint  Patient presents with   Other    HPI: Bobby Clements is a 51 y.o. male referred for evaluation of balanitis.  Complaining of penile rash since January 2023 No pain or itching Has been treated with nystatin and clotrimazole with improvement but not resolution Is uncircumcised and has diabetes No bothersome LUTS Denies dysuria, gross hematuria   PMH: Past Medical History:  Diagnosis Date   Paranoid schizophrenia (HCC)     Surgical History: Past Surgical History:  Procedure Laterality Date   NO PAST SURGERIES      Home Medications:  Allergies as of 08/06/2021   No Known Allergies      Medication List        Accurate as of Aug 06, 2021  8:32 AM. If you have any questions, ask your nurse or doctor.          clotrimazole 1 % cream Commonly known as: LOTRIMIN Apply 1 application. topically 2 (two) times daily.   cloZAPine 100 MG tablet Commonly known as: CLOZARIL Take 100 mg by mouth 2 (two) times daily. Take one tablet in the morning and two tablets at night   FLUoxetine 40 MG capsule Commonly known as: PROZAC Take 40 mg by mouth at bedtime.   metFORMIN 500 MG tablet Commonly known as: GLUCOPHAGE TAKE 1 TABLET BY MOUTH TWICE A DAY WITH A MEAL   nystatin cream Commonly known as: MYCOSTATIN Apply 1 application topically 2 (two) times daily.   rosuvastatin 40 MG tablet Commonly known as: CRESTOR TAKE 1 TABLET BY MOUTH DAILY   UltiCare Mini Pen Needles 31G X 6 MM Misc Generic drug: Insulin Pen Needle USE AS INSTRUCTED TO INJECT INSULIN ONCEA DAY        Allergies: No Known Allergies  Family History: Family History  Adopted: Yes    Social History:  reports that he has quit smoking. His smoking use included cigarettes. He smoked an average of 1 pack per day. He quit  smokeless tobacco use about 4 years ago. He reports that he does not drink alcohol and does not use drugs.   Physical Exam: BP 138/74   Pulse 82   Ht 5\' 5"  (1.651 m)   Wt 230 lb (104.3 kg)   BMI 38.27 kg/m   Constitutional:  Alert and oriented, No acute distress. HEENT: [place] AT, moist mucus membranes.  Trachea midline, no masses. Cardiovascular: No clubbing, cyanosis, or edema. Respiratory: Normal respiratory effort, no increased work of breathing. GI: Abdomen is soft, nontender, nondistended, no abdominal masses GU: Prepuce easily retracts.  Area intense erythema dorsal corona/inner prepuce and glans   Assessment & Plan:    1. Balanoposthitis Trial combination antifungal/steroid.  Rx Mycolog sent to pharmacy PA follow-up approximately 3 weeks.  If no improvement recommend scheduling biopsy   , MD  [facility] [address] [place], [place] [id number]

## 2021-08-07 LAB — URINALYSIS, COMPLETE
Bilirubin, UA: NEGATIVE
Glucose, UA: NEGATIVE
Ketones, UA: NEGATIVE
Leukocytes,UA: NEGATIVE
Nitrite, UA: NEGATIVE
Protein,UA: NEGATIVE
RBC, UA: NEGATIVE
Specific Gravity, UA: >1.03 — ABNORMAL HIGH (ref 1.005–1.030)
Urobilinogen, Ur: 0.2 mg/dL (ref 0.2–1.0)
pH, UA: 5.5 (ref 5.0–7.5)

## 2021-08-07 LAB — MICROSCOPIC EXAMINATION
Bacteria, UA: NONE SEEN
RBC, Urine: NONE SEEN /hpf (ref 0–2)

## 2021-08-27 ENCOUNTER — Ambulatory Visit: Payer: PPO | Admitting: Physician Assistant

## 2021-08-27 VITALS — BP 119/77 | HR 89 | Ht 66.0 in | Wt 231.0 lb

## 2021-08-27 DIAGNOSIS — N489 Disorder of penis, unspecified: Secondary | ICD-10-CM

## 2021-08-27 NOTE — Patient Instructions (Signed)
You still have a red spot on your penis. We need to check to make sure this is not cancer of your skin or penis. To check for this, we are scheduling you for a biopsy in our clinic. At your biopsy appointment, you will have numbing medicine put around your penis. We are also sending in a medication for you to take 30 minutes before your appointment for the biopsy. We will send this medication to your pharmacy the day before your biopsy, so you can pick the medication up then.

## 2021-08-27 NOTE — Progress Notes (Addendum)
08/27/2021 11:03 AM   Bobby Clements April 13, 1970 [id number]  CC: Chief Complaint  Patient presents with   Follow-up   HPI: Bobby Clements is a 51 y.o. male with PMH paranoid schizophrenia and possible balanitis refractory to topical antifungal creams who presents today for symptom recheck.   Today he reports his penis does not hurt him.  He does not know if his erythema has improved.  PMH: Past Medical History:  Diagnosis Date   Paranoid schizophrenia [facility] [place])     Surgical History: Past Surgical History:  Procedure Laterality Date   NO PAST SURGERIES      Home Medications:  Allergies as of 08/27/2021   No Known Allergies      Medication List        Accurate as of August 27, 2021 11:03 AM. If you have any questions, ask your nurse or doctor.          cloZAPine 100 MG tablet Commonly known as: CLOZARIL Take 100 mg by mouth 2 (two) times daily. Take one tablet in the morning and two tablets at night   FLUoxetine 40 MG capsule Commonly known as: PROZAC Take 40 mg by mouth at bedtime.   metFORMIN 500 MG tablet Commonly known as: GLUCOPHAGE TAKE 1 TABLET BY MOUTH TWICE A DAY WITH A MEAL   nystatin-triamcinolone ointment Commonly known as: MYCOLOG Apply 1 application. topically 2 (two) times daily.   rosuvastatin 40 MG tablet Commonly known as: CRESTOR TAKE 1 TABLET BY MOUTH DAILY   UltiCare Mini Pen Needles 31G X 6 MM Misc Generic drug: Insulin Pen Needle USE AS INSTRUCTED TO INJECT INSULIN ONCEA DAY        Allergies:  No Known Allergies  Family History: Family History  Adopted: Yes    Social History:   reports that he has quit smoking. His smoking use included cigarettes. He smoked an average of 1 pack per day. He quit smokeless tobacco use about 4 years ago. He reports that he does not drink alcohol and does not use drugs.  Physical Exam: BP 119/77   Pulse 89   Ht 5\' 6"  (1.676 m)   Wt 231 lb (104.8 kg)   BMI 37.28 kg/m    Constitutional:  Alert, no acute distress, nontoxic appearing HEENT: [place], AT Cardiovascular: No clubbing, cyanosis, or edema Respiratory: Normal respiratory effort, no increased work of breathing GU: Retractile foreskin.  Glans penis appears normal without erythema or edema.  There is a small, approximate 1 cm well demarcated, irregular bordered region of erythema at the base of the retracted foreskin along the dorsolateral aspect of the penis. Skin: No rashes, bruises or suspicious lesions Neurologic: Grossly intact, no focal deficits, moving all 4 extremities Psychiatric: Perseverant, repeating questions, circular conversation  Assessment & Plan:   1. Penile lesion Persistent erythematous penile lesion noted at the base of the retracted foreskin today.  It is unclear if this has improved on Mycolog cream compared to prior.  We discussed in clinic that given the lesions persistence, I recommend proceeding with a biopsy to rule out penile versus skin cancer.  Patient is extremely perseverant on the possibility of death associated with anesthesia today.  He does not wish to undergo biopsy under anesthesia for this reason.  We discussed alternatives including biopsy under sedation in the OR with a penile block versus biopsy in clinic with a penile block only.  He reports concerns regarding death associated with local anesthesia as well.  I explained that this  is an extremely remote risk and that there is a greater risk of morbidity versus mortality associated with an unknown treated penile versus skin cancer.  He ultimately agreed to biopsy in clinic with local anesthesia.  We will plan to premedicate him with diazepam given his significant anxiety in clinic today discussing his options.  He has a driver.  Return in about 2 weeks (around 09/10/2021) for Penile biopsy with Dr. Lonna Cobb.  Carman Ching, PA-C  [facility] [address] [place], [place]  [address] [phone]

## 2021-09-01 DIAGNOSIS — L723 Sebaceous cyst: Secondary | ICD-10-CM | POA: Diagnosis not present

## 2021-09-01 DIAGNOSIS — L03312 Cellulitis of back [any part except buttock]: Secondary | ICD-10-CM | POA: Diagnosis not present

## 2021-09-05 DIAGNOSIS — F209 Schizophrenia, unspecified: Secondary | ICD-10-CM | POA: Diagnosis not present

## 2021-09-10 ENCOUNTER — Other Ambulatory Visit: Payer: Self-pay | Admitting: Physician Assistant

## 2021-09-10 DIAGNOSIS — N489 Disorder of penis, unspecified: Secondary | ICD-10-CM

## 2021-09-10 MED ORDER — DIAZEPAM 2 MG PO TABS: ORAL_TABLET | ORAL | 0 refills | Status: DC

## 2021-09-11 ENCOUNTER — Encounter: Payer: Self-pay | Admitting: Urology

## 2021-09-11 ENCOUNTER — Ambulatory Visit: Payer: PPO | Admitting: Urology

## 2021-09-11 VITALS — BP 117/77 | HR 98 | Ht 70.0 in | Wt 231.0 lb

## 2021-09-11 DIAGNOSIS — N476 Balanoposthitis: Secondary | ICD-10-CM | POA: Diagnosis not present

## 2021-09-12 ENCOUNTER — Encounter: Payer: Self-pay | Admitting: Urology

## 2021-09-12 NOTE — Progress Notes (Signed)
   09/11/2021 1:54 PM   Bobby Clements June 05, 1970 [id number]  Referring provider: Glori Luis, MD [address] [place],  [place] [address]  Chief Complaint  Patient presents with   Other    HPI: 51 y.o. male scheduled for penile biopsy today for an area intense erythema dorsal corona/inner prepuce and glans that was persistent on follow-up exam 08/27/2021.  When he was being prepped for the procedure the CMA indicated she could not see any abnormalities.   PMH: Past Medical History:  Diagnosis Date   Paranoid schizophrenia [facility])     Surgical History: Past Surgical History:  Procedure Laterality Date   NO PAST SURGERIES      Home Medications:  Allergies as of 09/11/2021   No Known Allergies      Medication List        Accurate as of September 11, 2021 11:59 PM. If you have any questions, ask your nurse or doctor.          cloZAPine 100 MG tablet Commonly known as: CLOZARIL Take 100 mg by mouth 2 (two) times daily. Take one tablet in the morning and two tablets at night   diazepam 2 MG tablet Commonly known as: Valium Take one tablet by mouth 30-60 minutes prior to procedure. Do not operate heavy machinery while on this medication.   FLUoxetine 40 MG capsule Commonly known as: PROZAC Take 40 mg by mouth at bedtime.   metFORMIN 500 MG tablet Commonly known as: GLUCOPHAGE TAKE 1 TABLET BY MOUTH TWICE A DAY WITH A MEAL   nystatin-triamcinolone ointment Commonly known as: MYCOLOG Apply 1 application. topically 2 (two) times daily.   rosuvastatin 40 MG tablet Commonly known as: CRESTOR TAKE 1 TABLET BY MOUTH DAILY   UltiCare Mini Pen Needles 31G X 6 MM Misc Generic drug: Insulin Pen Needle USE AS INSTRUCTED TO INJECT INSULIN ONCEA DAY        Allergies: No Known Allergies  Family History: Family History  Adopted: Yes    Social History:  reports that he has quit smoking. His smoking use included cigarettes. He smoked an average of  1 pack per day. He quit smokeless tobacco use about 4 years ago. He reports that he does not drink alcohol and does not use drugs.   Physical Exam: BP 117/77   Pulse 98   Ht 5\' 10"  (1.778 m)   Wt 231 lb (104.8 kg)   BMI 33.15 kg/m   Constitutional:  Alert and oriented, No acute distress. GU: Prepuce easily retracts.  The previously noted area of intense erythema has almost completely resolved   Assessment & Plan:   The area of question has almost completely resolved indicating an inflammatory/infectious etiology Biopsy was not performed and he will return prn   , MD  [facility] [address] [place], [place] [phone]

## 2021-09-19 ENCOUNTER — Other Ambulatory Visit: Payer: Self-pay

## 2021-09-19 ENCOUNTER — Encounter: Payer: Self-pay | Admitting: Surgery

## 2021-09-19 ENCOUNTER — Ambulatory Visit (INDEPENDENT_AMBULATORY_CARE_PROVIDER_SITE_OTHER): Payer: PPO | Admitting: Surgery

## 2021-09-19 VITALS — BP 122/83 | HR 93 | Temp 98.7°F | Ht 69.0 in | Wt 229.2 lb

## 2021-09-19 DIAGNOSIS — L723 Sebaceous cyst: Secondary | ICD-10-CM

## 2021-09-19 MED ORDER — SULFAMETHOXAZOLE-TRIMETHOPRIM 800-160 MG PO TABS: 1.0000 | ORAL_TABLET | Freq: Two times a day (BID) | ORAL | 0 refills | Status: DC

## 2021-09-19 NOTE — Patient Instructions (Addendum)
You will need to remove the old packing material , shower , then repack the wound and cover with a dry dressing.   Please see your follow up appointment listed below. You may take Tylenol and Ibuprofen for the discomfort.

## 2021-09-19 NOTE — Progress Notes (Signed)
09/19/2021  Reason for Visit: Lower back infected cyst  History of Present Illness: Bobby Clements is a 51 y.o. male presenting for evaluation for lower back infected cyst.  The patient reports that he has had this for about 3 weeks now.  He presented to urgent care on 09/01/2021.  I&D was performed he was given a course of Bactrim.  The patient reports that he has been having intermittent drainage from the area but it continues to be very tender and sore with redness of the skin.  The patient completed his antibiotic course as instructed.  Denies any current fevers, chills, chest pain, shortness of breath.  Past Medical History: Past Medical History:  Diagnosis Date   Paranoid schizophrenia (HCC)      Past Surgical History: Past Surgical History:  Procedure Laterality Date   NO PAST SURGERIES      Home Medications: Prior to Admission medications   Medication Sig Start Date End Date Taking? Authorizing Provider  cloZAPine (CLOZARIL) 100 MG tablet Take 100 mg by mouth 2 (two) times daily. Take one tablet in the morning and two tablets at night 04/13/16  Yes [provider]  FLUoxetine (PROZAC) 40 MG capsule Take 40 mg by mouth at bedtime.  04/06/16  Yes [provider]  metFORMIN (GLUCOPHAGE) 500 MG tablet TAKE 1 TABLET BY MOUTH TWICE A DAY WITH A MEAL 03/25/21  Yes Glori Luis, MD  nystatin-triamcinolone ointment [facility]) Apply 1 application. topically 2 (two) times daily. 08/06/21  Yes Stoioff, Verna Czech, MD  rosuvastatin (CRESTOR) 40 MG tablet TAKE 1 TABLET BY MOUTH DAILY 05/28/21  Yes Worthy Rancher B, FNP  sulfamethoxazole-trimethoprim (BACTRIM DS) 800-160 MG tablet Take 1 tablet by mouth 2 (two) times daily. 09/19/21  Yes Roslind Michaux, Elita Quick, MD    Allergies: No Known Allergies  Social History:  reports that he quit smoking about 5 years ago. His smoking use included cigarettes. He smoked an average of 1 pack per day. He quit smokeless tobacco use about 4 years ago.  He reports that he does not drink alcohol and does not use drugs.   Family History: Family History  Adopted: Yes    Review of Systems: Review of Systems  Constitutional:  Negative for chills and fever.  Respiratory:  Negative for shortness of breath.   Cardiovascular:  Negative for chest pain.  Gastrointestinal:  Negative for abdominal pain, nausea and vomiting.  Skin:        Lower back infected cyst    Physical Exam BP 122/83   Pulse 93   Temp 98.7 F (37.1 C) (Oral)   Ht 5\' 9"  (1.753 m)   Wt 229 lb 3.2 oz (104 kg)   SpO2 97%   BMI 33.85 kg/m  CONSTITUTIONAL: No acute distress HEENT:  Normocephalic, atraumatic, extraocular motion intact. RESPIRATORY:  Normal respiratory effort without pathologic use of accessory muscles. CARDIOVASCULAR: Regular rhythm and rate MUSCULOSKELETAL:  Normal muscle strength and tone in all four extremities.  No peripheral edema or cyanosis. SKIN: The patient has in the lower back at the midline and area of about 4 cm of swelling, erythema that blanches, and tenderness.  In the central portion of this area, there is a prior scar versus entry port from this infected cyst.  There is some serous fluid that is seeping from the top portion of this area as well. NEUROLOGIC:  Motor and sensation is grossly normal.  Cranial nerves are grossly intact. PSYCH:  Alert and oriented to person, place and  time. Affect is normal.  Laboratory Analysis: No results found for this or any previous visit (from the past 24 hour(s)).  Imaging: No results found.  Assessment and Plan: [name] with an infected lower back cyst.  - Discussed with the patient and his wife that this is most likely an infected sebaceous cyst.  Despite of the I&D as described by urgent care on 09/01/2021, it appears this has come back and worsened again.  Discussed with the patient the need for another I&D procedure in order to better drain the purulence and infection with  subsequent packing of the wound to allow this to heal better from the inside out without the skin edges closing too early.  We will prescribing a new course of antibiotics to help with this infection.  We will also send new cultures from today's procedure.  Instructed patient how to pack the wound with quarter inch iodoform gauze followed by dry gauze dressing and tape.  He may change this once daily.  Risks of bleeding, infection, injury to surrounding structures were discussed with the patient he is willing to proceed.   Procedure Date:  09/19/2021  Pre-operative Diagnosis: Infected lower back sebaceous cyst  Post-operative Diagnosis: Infected lower back sebaceous cyst  Procedure:  Incision and Drainage of infected lower back sebaceous cyst  Surgeon:  Howie Ill, MD  Anesthesia: 5 mL of 1% lidocaine with epi  Estimated Blood Loss: 3 ml  Specimens: Culture swab  Complications: None  Indications for Procedure:  [name] with diagnosis of lower back infected sebaceous cyst, requiring drainage procedure.  The risks of bleeding, abscess or infection, injury to surrounding structures, and need for further procedures were all discussed with the patient and was willing to proceed.  Description of Procedure: The patient was correctly identified at bedside.  Appropriate time-outs were performed prior to procedure.  [name] was prepped and draped in usual sterile fashion.  Local anesthetic was infused intradermally.  A cruciate 1 cm incision was made over the abscess, revealing purulent fluid.  This fluid was swabbed for culture and sent to micro.  Small Kelly forceps were used to dissect around the abscess tissue to open any remaining pockets of purulent fluid.  After drainage was completed, the cavity was irrigated and cleaned.  The wound was packed with quarter inch gauze and covered with dry gauze and tape.  The patient tolerated the procedure well and all  sharps were appropriately disposed of at the end of the case.  -Patient may take Tylenol or ibuprofen for pain control. - Follow-up in 10 days for wound check.    I spent 30 minutes dedicated to the care of this patient on the date of this encounter to include pre-visit review of records, face-to-face time with the patient discussing diagnosis and management, and any post-visit coordination of care.   Howie Ill, MD [facility]

## 2021-09-22 DIAGNOSIS — F209 Schizophrenia, unspecified: Secondary | ICD-10-CM | POA: Diagnosis not present

## 2021-09-22 DIAGNOSIS — Z79899 Other long term (current) drug therapy: Secondary | ICD-10-CM | POA: Diagnosis not present

## 2021-09-22 DIAGNOSIS — F419 Anxiety disorder, unspecified: Secondary | ICD-10-CM | POA: Diagnosis not present

## 2021-09-27 NOTE — Telephone Encounter (Signed)
Error. ng 

## 2021-09-29 ENCOUNTER — Encounter: Payer: Self-pay | Admitting: Surgery

## 2021-09-29 ENCOUNTER — Ambulatory Visit (INDEPENDENT_AMBULATORY_CARE_PROVIDER_SITE_OTHER): Payer: PPO | Admitting: Surgery

## 2021-09-29 VITALS — BP 131/87 | HR 91 | Temp 98.7°F | Wt 234.4 lb

## 2021-09-29 DIAGNOSIS — L723 Sebaceous cyst: Secondary | ICD-10-CM

## 2021-09-29 DIAGNOSIS — L089 Local infection of the skin and subcutaneous tissue, unspecified: Secondary | ICD-10-CM

## 2021-09-29 DIAGNOSIS — Z09 Encounter for follow-up examination after completed treatment for conditions other than malignant neoplasm: Secondary | ICD-10-CM

## 2021-09-29 NOTE — Progress Notes (Signed)
09/29/2021  HPI: Bobby Clements is a 51 y.o. male s/p I&D of infected sebaceous cyst of the mid back.  Patient presents today for follow up.  He reports that he's doing better, denies any worsening pain.  His wife reports that it's becoming harder to pack the wound.  Vital signs: BP 131/87   Pulse 91   Temp 98.7 F (37.1 C) (Oral)   Wt 234 lb 6.4 oz (106.3 kg)   SpO2 96%   BMI 34.61 kg/m    Physical Exam: Constitutional: No acute distress Skin:  I&D site is healing well, with smaller abscess cavity.  The wound was probed with qtip and the cavity inside is about 2 cm in size.  Wound packed with 1/4 inch gauze, covered with dry gauze dressing.  Assessment/Plan: [name] s/p I&D of infected sebaceous cyst.  --Patient is healing well, without complications. --Instructed to continue packing the wound, and once it's shallow enough that gauze is unable to get packed, can transition to a dry gauze dressing only until the wound is fully healed. --Follow up as needed.   Howie Ill, MD [facility]

## 2021-09-29 NOTE — Patient Instructions (Signed)
If you have any concerns or questions, please feel free to call our office. Follow up as needed.   Incision and Drainage, Care After This sheet gives you information about how to care for yourself after your procedure. Your health care provider may also give you more specific instructions. If you have problems or questions, contact your health care provider. What can I expect after the procedure? After the procedure, it is common to have: Pain or discomfort around the incision site. Blood, fluid, or pus (drainage) from the incision. Redness and firm skin around the incision site. Follow these instructions at home: Medicines Take over-the-counter and prescription medicines only as told by your health care provider. If you were prescribed an antibiotic medicine, use or take it as told by your health care provider. Do not stop using the antibiotic even if you start to feel better. Wound care Follow instructions from your health care provider about how to take care of your wound. Make sure you: Wash your hands with soap and water before and after you change your bandage (dressing). If soap and water are not available, use hand sanitizer. Change your dressing and packing as told by your health care provider. If your dressing is dry or stuck when you try to remove it, moisten or wet the dressing with saline or water so that it can be removed without harming your skin or tissues. If your wound is packed, leave it in place until your health care provider tells you to remove it. To remove the packing, moisten or wet the packing with saline or water so that it can be removed without harming your skin or tissues. Leave stitches (sutures), skin glue, or adhesive strips in place. These skin closures may need to stay in place for 2 weeks or longer. If adhesive strip edges start to loosen and curl up, you may trim the loose edges. Do not remove adhesive strips completely unless your health care provider tells  you to do that. Check your wound every day for signs of infection. Check for: More redness, swelling, or pain. More fluid or blood. Warmth. Pus or a bad smell. If you were sent home with a drain tube in place, follow instructions from your health care provider about: How to empty it. How to care for it at home.  General instructions Rest the affected area. Do not take baths, swim, or use a hot tub until your health care provider approves. Ask your health care provider if you may take showers. You may only be allowed to take sponge baths. Return to your normal activities as told by your health care provider. Ask your health care provider what activities are safe for you. Your health care provider may put you on activity or lifting restrictions. The incision will continue to drain. It is normal to have some clear or slightly bloody drainage. The amount of drainage should lessen each day. Do not apply any creams, ointments, or liquids unless you have been told to by your health care provider. Keep all follow-up visits as told by your health care provider. This is important. Contact a health care provider if: Your cyst or abscess returns. You have more redness, swelling, or pain around your incision. You have more fluid or blood coming from your incision. Your incision feels warm to the touch. You have pus or a bad smell coming from your incision. You have red streaks above or below the incision site. Get help right away if: You have severe pain   or bleeding. You cannot eat or drink without vomiting. You have a fever or chills. You have redness that spreads quickly. You have decreased urine output. You become short of breath. You have chest pain. You cough up blood. The affected area becomes numb or starts to tingle. These symptoms may represent a serious problem that is an emergency. Do not wait to see if the symptoms will go away. Get medical help right away. Call your local emergency  services (911 in the [place] Do not drive yourself to the hospital. Summary After this procedure, it is common to have fluid, blood, or pus coming from the surgery site. Follow all home care instructions. You will be told how to take care of your incision, how to check for infection, and how to take medicines. If you were prescribed an antibiotic medicine, take it as told by your health care provider. Do not stop taking the antibiotic even if you start to feel better. Contact a health care provider if you have increased redness, swelling, or pain around your incision. Get help right away if you have chest pain, you vomit, you cough up blood, or you have shortness of breath. Keep all follow-up visits as told by your health care provider. This is important. This information is not intended to replace advice given to you by your health care provider. Make sure you discuss any questions you have with your health care provider. Document Revised: 12/12/2020 Document Reviewed: 12/12/2020 Elsevier Patient Education  2023 Elsevier Inc.  

## 2021-10-06 DIAGNOSIS — F209 Schizophrenia, unspecified: Secondary | ICD-10-CM | POA: Diagnosis not present

## 2021-10-07 ENCOUNTER — Encounter: Payer: Self-pay | Admitting: Family Medicine

## 2021-10-07 ENCOUNTER — Ambulatory Visit (INDEPENDENT_AMBULATORY_CARE_PROVIDER_SITE_OTHER): Payer: PPO | Admitting: Family Medicine

## 2021-10-07 VITALS — BP 130/80 | HR 93 | Temp 98.8°F | Ht 69.0 in | Wt 236.8 lb

## 2021-10-07 DIAGNOSIS — E119 Type 2 diabetes mellitus without complications: Secondary | ICD-10-CM

## 2021-10-07 DIAGNOSIS — E785 Hyperlipidemia, unspecified: Secondary | ICD-10-CM

## 2021-10-07 DIAGNOSIS — N476 Balanoposthitis: Secondary | ICD-10-CM

## 2021-10-07 DIAGNOSIS — Z794 Long term (current) use of insulin: Secondary | ICD-10-CM | POA: Diagnosis not present

## 2021-10-07 LAB — POCT GLYCOSYLATED HEMOGLOBIN (HGB A1C): Hemoglobin A1C: 6.4 % — AB (ref 4.0–5.6)

## 2021-10-07 NOTE — Assessment & Plan Note (Signed)
Well controlled. Continue metformin 500 mg BID.  

## 2021-10-07 NOTE — Progress Notes (Signed)
Marikay Alar, MD Phone: [phone]  Bobby Clements is a 51 y.o. male who presents today for f/u.  DIABETES Disease Monitoring: Blood Sugar ranges-110-150 fasting Polyuria/phagia/dipsia- no      Optho- due Medications: Compliance- taking metformin Hypoglycemic symptoms- no Notes he bumped his right ankle in to something this morning and has a small abrasion. No excessive bleeding.   HYPERLIPIDEMIA Symptoms Chest pain on exertion:  no    Medications: Compliance- taking crestor Right upper quadrant pain- no  Muscle aches- no Lipid Panel     Component Value Date/Time   CHOL 123 12/11/2020 1026   TRIG 152.0 (H) 12/11/2020 1026   HDL 46.20 12/11/2020 1026   CHOLHDL 3 12/11/2020 1026   VLDL 30.4 12/11/2020 1026   LDLCALC 47 12/11/2020 1026   LDLDIRECT 64.0 07/01/2020 0839      Social History   Tobacco Use  Smoking Status Former   Packs/day: 1.00   Types: Cigarettes   Quit date: 2018   Years since quitting: 5.5  Smokeless Tobacco Former   Quit date: 12/14/2016    Current Outpatient Medications on File Prior to Visit  Medication Sig Dispense Refill   cloZAPine (CLOZARIL) 100 MG tablet Take 100 mg by mouth 2 (two) times daily. Take one tablet in the morning and two tablets at night     FLUoxetine (PROZAC) 40 MG capsule Take 40 mg by mouth at bedtime.      metFORMIN (GLUCOPHAGE) 500 MG tablet TAKE 1 TABLET BY MOUTH TWICE A DAY WITH A MEAL 180 tablet 2   nystatin-triamcinolone ointment (MYCOLOG) Apply 1 application. topically 2 (two) times daily. 30 g 0   rosuvastatin (CRESTOR) 40 MG tablet TAKE 1 TABLET BY MOUTH DAILY 90 tablet 1   No current facility-administered medications on file prior to visit.     ROS see history of present illness  Objective  Physical Exam Vitals:   10/07/21 0814 10/07/21 0832  BP: 140/80 130/80  Pulse: 93   Temp: 98.8 F (37.1 C)   SpO2: 97%     BP Readings from Last 3 Encounters:  10/07/21 130/80  09/29/21 131/87  09/19/21  122/83   Wt Readings from Last 3 Encounters:  10/07/21 236 lb 12.8 oz (107.4 kg)  09/29/21 234 lb 6.4 oz (106.3 kg)  09/19/21 229 lb 3.2 oz (104 kg)    Physical Exam Constitutional:      General: He is not in acute distress.    Appearance: He is not diaphoretic.  Cardiovascular:     Rate and Rhythm: Normal rate and regular rhythm.     Heart sounds: Normal heart sounds.  Pulmonary:     Effort: Pulmonary effort is normal.     Breath sounds: Normal breath sounds.  Skin:    General: Skin is warm and dry.  Neurological:     Mental Status: He is alert.      Assessment/Plan: Please see individual problem list.  Problem List Items Addressed This Visit     Balanoposthitis (Chronic)    Reports this resolved. He saw urology and they planned on a biopsy though once it resolved this was cancelled.       DM (diabetes mellitus), type 2 (HCC) - Primary (Chronic)    Well controlled. Continue metformin 500 mg BID.       Relevant Orders   POCT HgB A1C (Completed)   HLD (hyperlipidemia) (Chronic)    Well controlled on most recent check. Continue crestor 40 mg daily.  Health Maintenance: patient notes his pharmacy does not do tetanus shots, though he will have his mom look for a [facility] or [facility] that he can go to for this. He declines colon cancer screening. He is aware that by not having screening we could be missing a lesion. He see's ophthalmology later this week.   Return in about 6 months (around 04/09/2022) for diabetes.   Marikay Alar, MD [facility]

## 2021-10-07 NOTE — Assessment & Plan Note (Signed)
Well controlled on most recent check. Continue crestor 40 mg daily.

## 2021-10-07 NOTE — Assessment & Plan Note (Signed)
Reports this resolved. He saw urology and they planned on a biopsy though once it resolved this was cancelled.

## 2021-10-07 NOTE — Patient Instructions (Signed)
Nice to see you. Please continue the metformin.  Your diabetes is well controlled.

## 2021-10-10 DIAGNOSIS — E119 Type 2 diabetes mellitus without complications: Secondary | ICD-10-CM | POA: Diagnosis not present

## 2021-10-10 LAB — HM DIABETES EYE EXAM

## 2021-10-29 ENCOUNTER — Other Ambulatory Visit: Payer: Self-pay

## 2021-10-29 DIAGNOSIS — E785 Hyperlipidemia, unspecified: Secondary | ICD-10-CM

## 2021-10-29 MED ORDER — ROSUVASTATIN CALCIUM 40 MG PO TABS: 40.0000 mg | ORAL_TABLET | Freq: Every day | ORAL | 1 refills | Status: DC

## 2021-11-03 DIAGNOSIS — F209 Schizophrenia, unspecified: Secondary | ICD-10-CM | POA: Diagnosis not present

## 2021-12-02 DIAGNOSIS — F209 Schizophrenia, unspecified: Secondary | ICD-10-CM | POA: Diagnosis not present

## 2021-12-12 ENCOUNTER — Ambulatory Visit: Payer: PPO | Admitting: Family Medicine

## 2022-01-09 DIAGNOSIS — F209 Schizophrenia, unspecified: Secondary | ICD-10-CM | POA: Diagnosis not present

## 2022-01-12 DIAGNOSIS — F209 Schizophrenia, unspecified: Secondary | ICD-10-CM | POA: Diagnosis not present

## 2022-01-12 DIAGNOSIS — F419 Anxiety disorder, unspecified: Secondary | ICD-10-CM | POA: Diagnosis not present

## 2022-01-20 ENCOUNTER — Other Ambulatory Visit: Payer: Self-pay | Admitting: Family Medicine

## 2022-02-10 DIAGNOSIS — F209 Schizophrenia, unspecified: Secondary | ICD-10-CM | POA: Diagnosis not present

## 2022-04-06 DIAGNOSIS — F209 Schizophrenia, unspecified: Secondary | ICD-10-CM | POA: Diagnosis not present

## 2022-04-07 DIAGNOSIS — F419 Anxiety disorder, unspecified: Secondary | ICD-10-CM | POA: Diagnosis not present

## 2022-04-07 DIAGNOSIS — F209 Schizophrenia, unspecified: Secondary | ICD-10-CM | POA: Diagnosis not present

## 2022-04-07 DIAGNOSIS — Z79899 Other long term (current) drug therapy: Secondary | ICD-10-CM | POA: Diagnosis not present

## 2022-04-10 ENCOUNTER — Ambulatory Visit (INDEPENDENT_AMBULATORY_CARE_PROVIDER_SITE_OTHER): Payer: PPO | Admitting: Family Medicine

## 2022-04-10 ENCOUNTER — Encounter: Payer: Self-pay | Admitting: Family Medicine

## 2022-04-10 VITALS — BP 120/80 | HR 103 | Temp 98.3°F | Ht 69.0 in | Wt 237.4 lb

## 2022-04-10 DIAGNOSIS — Z23 Encounter for immunization: Secondary | ICD-10-CM | POA: Diagnosis not present

## 2022-04-10 DIAGNOSIS — E119 Type 2 diabetes mellitus without complications: Secondary | ICD-10-CM

## 2022-04-10 DIAGNOSIS — E785 Hyperlipidemia, unspecified: Secondary | ICD-10-CM | POA: Diagnosis not present

## 2022-04-10 DIAGNOSIS — Z794 Long term (current) use of insulin: Secondary | ICD-10-CM

## 2022-04-10 LAB — LIPID PANEL
Cholesterol: 108 mg/dL (ref 0–200)
HDL: 38.7 mg/dL — ABNORMAL LOW (ref >39.00)
LDL Cholesterol: 44 mg/dL (ref 0–99)
NonHDL: 68.83
Total CHOL/HDL Ratio: 3
Triglycerides: 122 mg/dL (ref 0.0–149.0)
VLDL: 24.4 mg/dL (ref 0.0–40.0)

## 2022-04-10 LAB — HEMOGLOBIN A1C: Hgb A1c MFr Bld: 6.7 % — ABNORMAL HIGH (ref 4.6–6.5)

## 2022-04-10 LAB — COMPREHENSIVE METABOLIC PANEL
ALT: 14 U/L (ref 0–53)
AST: 13 U/L (ref 0–37)
Albumin: 4.3 g/dL (ref 3.5–5.2)
Alkaline Phosphatase: 53 U/L (ref 39–117)
BUN: 11 mg/dL (ref 6–23)
CO2: 27 mEq/L (ref 19–32)
Calcium: 9.1 mg/dL (ref 8.4–10.5)
Chloride: 105 mEq/L (ref 96–112)
Creatinine, Ser: 0.63 mg/dL (ref 0.40–1.50)
GFR: 109.85 mL/min (ref >60.00)
Glucose, Bld: 185 mg/dL — ABNORMAL HIGH (ref 70–99)
Potassium: 3.8 mEq/L (ref 3.5–5.1)
Sodium: 141 mEq/L (ref 135–145)
Total Bilirubin: 0.7 mg/dL (ref 0.2–1.2)
Total Protein: 6.5 g/dL (ref 6.0–8.3)

## 2022-04-10 MED ORDER — ROSUVASTATIN CALCIUM 40 MG PO TABS: 40.0000 mg | ORAL_TABLET | Freq: Every day | ORAL | 1 refills | Status: DC

## 2022-04-10 NOTE — Assessment & Plan Note (Signed)
Chronic issue.  Continue Crestor 40 mg daily.  Check lipid panel and CMP.

## 2022-04-10 NOTE — Assessment & Plan Note (Addendum)
Chronic issue.  Generally well-controlled.  He will check A1c today.  Continue metformin 500 mg twice daily.

## 2022-04-10 NOTE — Patient Instructions (Signed)
Nice to see you. If you can start checking your blood pressure at home that would be great.  Your goal blood pressure is less than 130/80. We will contact you with your lab results. Please consider getting the Shingrix vaccine at the pharmacy to cover for shingles.

## 2022-04-10 NOTE — Progress Notes (Signed)
Bobby Rumps, MD Phone: [phone]  RANIER COACH is a 52 y.o. male who presents today for f/u.  DIABETES Disease Monitoring: Blood Sugar ranges-110-144 fasting Polyuria/phagia/dipsia- no      Optho- UTD Medications: Compliance- taking metformin Hypoglycemic symptoms- no  HYPERLIPIDEMIA Symptoms Chest pain on exertion:  no   Leg claudication:   no Medications: Compliance- taking crestor Right upper quadrant pain- no  Muscle aches- no Walks a lot for exercise.  Lipid Panel     Component Value Date/Time   CHOL 123 12/11/2020 1026   TRIG 152.0 (H) 12/11/2020 1026   HDL 46.20 12/11/2020 1026   CHOLHDL 3 12/11/2020 1026   VLDL 30.4 12/11/2020 1026   LDLCALC 47 12/11/2020 1026   LDLDIRECT 64.0 07/01/2020 0839      Social History   Tobacco Use  Smoking Status Former   Packs/day: 1.00   Types: Cigarettes   Quit date: 2018   Years since quitting: 6.0  Smokeless Tobacco Former   Quit date: 12/14/2016    Current Outpatient Medications on File Prior to Visit  Medication Sig Dispense Refill   cloZAPine (CLOZARIL) 100 MG tablet Take 100 mg by mouth 2 (two) times daily. Take one tablet in the morning and two tablets at night     FLUoxetine (PROZAC) 40 MG capsule Take 40 mg by mouth at bedtime.      metFORMIN (GLUCOPHAGE) 500 MG tablet TAKE 1 TABLET BY MOUTH TWICE A DAY WITH A MEAL 180 tablet 2   nystatin-triamcinolone ointment (MYCOLOG) Apply 1 application. topically 2 (two) times daily. 30 g 0   No current facility-administered medications on file prior to visit.     ROS see history of present illness  Objective  Physical Exam Vitals:   04/10/22 0859 04/10/22 0907  BP: 130/80 120/80  Pulse: (!) 103   Temp: 98.3 F (36.8 C)   SpO2: 96%     BP Readings from Last 3 Encounters:  04/10/22 120/80  10/07/21 130/80  09/29/21 131/87   Wt Readings from Last 3 Encounters:  04/10/22 237 lb 6.4 oz (107.7 kg)  10/07/21 236 lb 12.8 oz (107.4 kg)  09/29/21 234  lb 6.4 oz (106.3 kg)    Physical Exam Constitutional:      General: He is not in acute distress.    Appearance: He is not diaphoretic.  Cardiovascular:     Rate and Rhythm: Normal rate and regular rhythm.     Heart sounds: Normal heart sounds.  Pulmonary:     Effort: Pulmonary effort is normal.     Breath sounds: Normal breath sounds.  Musculoskeletal:     Right lower leg: No edema.     Left lower leg: No edema.  Skin:    General: Skin is warm and dry.  Neurological:     Mental Status: He is alert.      Assessment/Plan: Please see individual problem list.  Hyperlipidemia, unspecified hyperlipidemia type Assessment & Plan: Chronic issue.  Continue Crestor 40 mg daily.  Check lipid panel and CMP.  Orders: -     Rosuvastatin Calcium; Take 1 tablet (40 mg total) by mouth daily.  Dispense: 90 tablet; Refill: 1 -     Comprehensive metabolic panel -     Lipid panel  Type 2 diabetes mellitus without complication, with long-term current use of insulin (HCC) Assessment & Plan: Chronic issue.  Generally well-controlled.  He will check A1c today.  Continue metformin 500 mg twice daily.  Orders: -  Hemoglobin A1c  Need for immunization against influenza -     Flu Vaccine QUAD 5mo+IM (Fluarix, Fluzone & Alfiuria Quad PF)  Patient will start checking BP at home. Discussed goal BP of less than 130/80.  Return in about 6 months (around 10/09/2022) for Diabetes/hyperlipidemia.   Bobby Rumps, MD [place]

## 2022-05-12 DIAGNOSIS — Z79899 Other long term (current) drug therapy: Secondary | ICD-10-CM | POA: Diagnosis not present

## 2022-05-12 DIAGNOSIS — F209 Schizophrenia, unspecified: Secondary | ICD-10-CM | POA: Diagnosis not present

## 2022-06-08 DIAGNOSIS — Z79899 Other long term (current) drug therapy: Secondary | ICD-10-CM | POA: Diagnosis not present

## 2022-06-08 DIAGNOSIS — F209 Schizophrenia, unspecified: Secondary | ICD-10-CM | POA: Diagnosis not present

## 2022-07-14 DIAGNOSIS — Z79899 Other long term (current) drug therapy: Secondary | ICD-10-CM | POA: Diagnosis not present

## 2022-07-14 DIAGNOSIS — F209 Schizophrenia, unspecified: Secondary | ICD-10-CM | POA: Diagnosis not present

## 2022-07-15 ENCOUNTER — Ambulatory Visit (INDEPENDENT_AMBULATORY_CARE_PROVIDER_SITE_OTHER): Payer: PPO | Admitting: Family Medicine

## 2022-07-15 ENCOUNTER — Encounter: Payer: Self-pay | Admitting: Family Medicine

## 2022-07-15 VITALS — BP 120/82 | HR 82 | Temp 98.5°F | Ht 69.0 in | Wt 229.4 lb

## 2022-07-15 DIAGNOSIS — E119 Type 2 diabetes mellitus without complications: Secondary | ICD-10-CM

## 2022-07-15 DIAGNOSIS — Z87891 Personal history of nicotine dependence: Secondary | ICD-10-CM | POA: Diagnosis not present

## 2022-07-15 DIAGNOSIS — E785 Hyperlipidemia, unspecified: Secondary | ICD-10-CM | POA: Diagnosis not present

## 2022-07-15 DIAGNOSIS — Z7984 Long term (current) use of oral hypoglycemic drugs: Secondary | ICD-10-CM | POA: Diagnosis not present

## 2022-07-15 DIAGNOSIS — Z794 Long term (current) use of insulin: Secondary | ICD-10-CM | POA: Diagnosis not present

## 2022-07-15 LAB — MICROALBUMIN / CREATININE URINE RATIO
Creatinine,U: 146.6 mg/dL
Microalb Creat Ratio: 0.6 mg/g (ref 0.0–30.0)
Microalb, Ur: 0.8 mg/dL (ref 0.0–1.9)

## 2022-07-15 LAB — POCT GLYCOSYLATED HEMOGLOBIN (HGB A1C): Hemoglobin A1C: 6.3 % — AB (ref 4.0–5.6)

## 2022-07-15 MED ORDER — ROSUVASTATIN CALCIUM 40 MG PO TABS: 40.0000 mg | ORAL_TABLET | Freq: Every day | ORAL | 3 refills | Status: DC

## 2022-07-15 NOTE — Assessment & Plan Note (Signed)
Chronic issue.  Seems well-controlled based on home CBGs.  Check A1c.  Continue metformin 500 mg twice daily.

## 2022-07-15 NOTE — Progress Notes (Signed)
Marikay Alar, MD Phone: [phone]  Bobby Clements is a 52 y.o. male who presents today for f/u.  DIABETES Disease Monitoring: Blood Sugar ranges-110s-140s Polyuria/phagia/dipsia- no      Optho- UTD Medications: Compliance- taking metformin Hypoglycemic symptoms- no  Former smoker: Patient notes he quit smoking 6 years ago.  He smoked starting at age 89.  He smoked 1.5-2 packs/day until he quit.   Social History   Tobacco Use  Smoking Status Former   Packs/day: 1.50   Years: 32.00   Additional pack years: 0.00   Total pack years: 48.00   Types: Cigarettes   Start date: 19   Quit date: 2018   Years since quitting: 6.3  Smokeless Tobacco Former   Quit date: 12/14/2016    Current Outpatient Medications on File Prior to Visit  Medication Sig Dispense Refill   cloZAPine (CLOZARIL) 100 MG tablet Take 100 mg by mouth 2 (two) times daily. Take one tablet in the morning and two tablets at night     FLUoxetine (PROZAC) 40 MG capsule Take 40 mg by mouth at bedtime.      metFORMIN (GLUCOPHAGE) 500 MG tablet TAKE 1 TABLET BY MOUTH TWICE A DAY WITH A MEAL 180 tablet 2   nystatin-triamcinolone ointment (MYCOLOG) Apply 1 application. topically 2 (two) times daily. 30 g 0   No current facility-administered medications on file prior to visit.     ROS see history of present illness  Objective  Physical Exam Vitals:   07/15/22 0934  BP: 120/82  Pulse: 82  Temp: 98.5 F (36.9 C)  SpO2: 96%    BP Readings from Last 3 Encounters:  07/15/22 120/82  04/10/22 120/80  10/07/21 130/80   Wt Readings from Last 3 Encounters:  07/15/22 229 lb 6.4 oz (104.1 kg)  04/10/22 237 lb 6.4 oz (107.7 kg)  10/07/21 236 lb 12.8 oz (107.4 kg)    Physical Exam Constitutional:      General: He is not in acute distress.    Appearance: He is not diaphoretic.  Cardiovascular:     Rate and Rhythm: Normal rate and regular rhythm.     Heart sounds: Normal heart sounds.  Pulmonary:      Effort: Pulmonary effort is normal.     Breath sounds: Normal breath sounds.  Musculoskeletal:     Right lower leg: No edema.     Left lower leg: No edema.  Skin:    General: Skin is warm and dry.  Neurological:     Mental Status: He is alert.      Assessment/Plan: Please see individual problem list.  Type 2 diabetes mellitus without complication, with long-term current use of insulin (HCC) Assessment & Plan: Chronic issue.  Seems well-controlled based on home CBGs.  Check A1c.  Continue metformin 500 mg twice daily.  Orders: -     Microalbumin / creatinine urine ratio -     POCT glycosylated hemoglobin (Hb A1C)  Former smoker Assessment & Plan: Chronic issue.  Congratulated on smoking cessation.  Discussed lung cancer screening given his smoking history.  He wants to think about this.   Hyperlipidemia, unspecified hyperlipidemia type -     Rosuvastatin Calcium; Take 1 tablet (40 mg total) by mouth daily.  Dispense: 100 tablet; Refill: 3     Health Maintenance: Patient declines colonoscopy.  He understands the risk of missing a significant lesion or cancer.  Return in about 6 months (around 01/15/2023) for physical.   Marikay Alar, MD  Primary  [place]

## 2022-07-15 NOTE — Assessment & Plan Note (Signed)
Chronic issue.  Congratulated on smoking cessation.  Discussed lung cancer screening given his smoking history.  He wants to think about this.

## 2022-07-24 ENCOUNTER — Ambulatory Visit (INDEPENDENT_AMBULATORY_CARE_PROVIDER_SITE_OTHER): Payer: PPO

## 2022-07-24 VITALS — Wt 230.0 lb

## 2022-07-24 DIAGNOSIS — Z0000 Encounter for general adult medical examination without abnormal findings: Secondary | ICD-10-CM

## 2022-07-24 NOTE — Progress Notes (Cosign Needed Addendum)
Subjective:   Bobby Clements is a 52 y.o. male who presents for Medicare Annual/Subsequent preventive examination.  Review of Systems    I connected with  Bobby Clements on 07/24/22 by a audio enabled telemedicine application and verified that I am speaking with the correct person using two identifiers.  Patient Location: Home  Provider Location: Home Office  I discussed the limitations of evaluation and management by telemedicine. The patient expressed understanding and agreed to proceed.  Cardiac Risk Factors include: advanced age (>68men, >76 women);diabetes mellitus     Objective:    Today's Vitals   07/24/22 1027  Weight: 230 lb (104.3 kg)   Body mass index is 33.97 kg/m.     07/24/2022   10:33 AM  Advanced Directives  Does Patient Have a Medical Advance Directive? No  Would patient like information on creating a medical advance directive? No - Patient declined    Current Medications (verified) Outpatient Encounter Medications as of 07/24/2022  Medication Sig   cloZAPine (CLOZARIL) 100 MG tablet Take 100 mg by mouth 2 (two) times daily. Take one tablet in the morning and two tablets at night   FLUoxetine (PROZAC) 40 MG capsule Take 40 mg by mouth at bedtime.    metFORMIN (GLUCOPHAGE) 500 MG tablet TAKE 1 TABLET BY MOUTH TWICE A DAY WITH A MEAL   nystatin-triamcinolone ointment (MYCOLOG) Apply 1 application. topically 2 (two) times daily.   rosuvastatin (CRESTOR) 40 MG tablet Take 1 tablet (40 mg total) by mouth daily.   No facility-administered encounter medications on file as of 07/24/2022.    Allergies (verified) Patient has no known allergies.   History: Past Medical History:  Diagnosis Date   Paranoid schizophrenia (HCC)    Past Surgical History:  Procedure Laterality Date   NO PAST SURGERIES     Family History  Adopted: Yes   Social History   Socioeconomic History   Marital status: Single    Spouse name: Not on file   Number of children:  Not on file   Years of education: Not on file   Highest education level: Not on file  Occupational History   Not on file  Tobacco Use   Smoking status: Former    Packs/day: 1.50    Years: 32.00    Additional pack years: 0.00    Total pack years: 48.00    Types: Cigarettes    Start date: 89    Quit date: 2018    Years since quitting: 6.3   Smokeless tobacco: Former    Quit date: 12/14/2016  Vaping Use   Vaping Use: Never used  Substance and Sexual Activity   Alcohol use: No   Drug use: No   Sexual activity: Never    Partners: Female  Other Topics Concern   Not on file  Social History Narrative   Not on file   Social Determinants of Health   Financial Resource Strain: Low Risk  (07/24/2022)   Overall Financial Resource Strain (CARDIA)    Difficulty of Paying Living Expenses: Not hard at all  Food Insecurity: No Food Insecurity (07/24/2022)   Hunger Vital Sign    Worried About Running Out of Food in the Last Year: Never true    Ran Out of Food in the Last Year: Never true  Transportation Needs: No Transportation Needs (07/24/2022)   PRAPARE - Administrator, Civil Service (Medical): No    Lack of Transportation (Non-Medical): No  Physical Activity: Sufficiently Active (  07/24/2022)   Exercise Vital Sign    Days of Exercise per Week: 7 days    Minutes of Exercise per Session: 30 min  Stress: No Stress Concern Present (07/24/2022)   [employer] of Occupational Health - Occupational Stress Questionnaire    Feeling of Stress : Not at all  Social Connections: Moderately Integrated (07/24/2022)   Social Connection and Isolation Panel [NHANES]    Frequency of Communication with Friends and Family: More than three times a week    Frequency of Social Gatherings with Friends and Family: More than three times a week    Attends Religious Services: More than 4 times per year    Active Member of [employer] or Organizations: Yes    Attends Engineer, structural:  More than 4 times per year    Marital Status: Never married    Tobacco Counseling Counseling given: Yes   Clinical Intake:  Pre-visit preparation completed: Yes  Pain : No/denies pain     BMI - recorded: 33.97 Nutritional Status: BMI > 30  Obese Nutritional Risks: None Diabetes: Yes CBG done?: No Did pt. bring in CBG monitor from home?: No  How often do you need to have someone help you when you read instructions, pamphlets, or other written materials from your doctor or pharmacy?: 1 - Never  Diabetic?YES  Interpreter Needed?: No  Information entered by :: Fredirick Maudlin   Activities of Daily Living    07/24/2022   10:34 AM  In your present state of health, do you have any difficulty performing the following activities:  Hearing? 0  Vision? 0  Difficulty concentrating or making decisions? 0  Walking or climbing stairs? 0  Dressing or bathing? 0  Doing errands, shopping? 0  Preparing Food and eating ? N  Using the Toilet? N  In the past six months, have you accidently leaked urine? N  Do you have problems with loss of bowel control? N  Managing your Medications? N  Managing your Finances? N  Housekeeping or managing your Housekeeping? N    Patient Care Team: Glori Luis, MD as PCP - General (Family Medicine) Glori Luis, MD as Referring Physician (Family Medicine)  Indicate any recent Medical Services you may have received from other than [facility] providers in the past year (date may be approximate).     Assessment:   This is a routine wellness examination for [place].  Hearing/Vision screen Hearing Screening - Comments:: Denies hearing difficulties   Vision Screening - Comments:: Patient has  yearly eye exam at [facility]   Dietary issues and exercise activities discussed: Current Exercise Habits: Home exercise routine, Type of exercise: walking (yard work), Time (Minutes): 50, Intensity: Moderate   Goals Addressed   None    Depression Screen    07/24/2022   10:29 AM 07/15/2022    9:35 AM 04/10/2022    9:00 AM 10/07/2021    8:15 AM 06/10/2021    8:27 AM 12/11/2020   10:08 AM 12/13/2019   11:13 AM  PHQ 2/9 Scores  PHQ - 2 Score 0 0 0 0 0 0 0  PHQ- 9 Score 0 0         Fall Risk    07/24/2022   10:44 AM 07/15/2022    9:35 AM 04/10/2022    9:00 AM 09/19/2021    9:53 AM 06/10/2021    8:27 AM  Fall Risk   Falls in the past year? 0 0 0 0 0  Number  falls in past yr: 0 0 0  0  Injury with Fall? 0 0 0  0  Risk for fall due to : No Fall Risks No Fall Risks No Fall Risks  No Fall Risks  Follow up Falls prevention discussed;Falls evaluation completed Falls evaluation completed Falls evaluation completed  Falls evaluation completed    FALL RISK PREVENTION PERTAINING TO THE HOME:  Any stairs in or around the home? No  If so, are there any without handrails? No  Home free of loose throw rugs in walkways, pet beds, electrical cords, etc? No  Adequate lighting in your home to reduce risk of falls? No   ASSISTIVE DEVICES UTILIZED TO PREVENT FALLS:  Life alert? No  Use of a cane, walker or w/c? No  Grab bars in the bathroom? No  Shower chair or bench in shower? No  Elevated toilet seat or a handicapped toilet? No   TIMED UP AND GO:  Was the test performed?  NO Televisit  .     Cognitive Function:        07/24/2022   10:30 AM  6CIT Screen  What Year? 0 points  What month? 0 points  What time? 0 points  Count back from 20 0 points  Months in reverse 0 points  Repeat phrase 2 points  Total Score 2 points    Immunizations Immunization History  Administered Date(s) Administered   Influenza,inj,Quad PF,6+ Mos 01/13/2017, 04/18/2018, 12/13/2019, 12/11/2020, 04/10/2022   Pneumococcal Polysaccharide-23 01/13/2017   Tdap 04/06/2022    TDAP status: Up to date  Flu Vaccine status: Up to date  Pneumococcal vaccine status: Up to date  Covid-19 vaccine status: Completed vaccines  Qualifies for Shingles  Vaccine? No   Zostavax completed No   Shingrix Completed?: No.    Education has been provided regarding the importance of this vaccine. Patient has been advised to call insurance company to determine out of pocket expense if they have not yet received this vaccine. Advised may also receive vaccine at local pharmacy or Health Dept. Verbalized acceptance and understanding.  Screening Tests Health Maintenance  Topic Date Due   Zoster Vaccines- Shingrix (1 of 2) Never done   Lung Cancer Screening  05/22/2020   COLONOSCOPY (Pts 45-18yrs Insurance coverage will need to be confirmed)  10/08/2022 (Originally 05/23/2015)   FOOT EXAM  10/08/2022   OPHTHALMOLOGY EXAM  10/11/2022   INFLUENZA VACCINE  10/15/2022   HEMOGLOBIN A1C  01/15/2023   Diabetic kidney evaluation - eGFR measurement  04/11/2023   Diabetic kidney evaluation - Urine ACR  07/15/2023   Medicare Annual Wellness (AWV)  07/24/2023   DTaP/Tdap/Td (2 - Td or Tdap) 04/06/2032   Hepatitis C Screening  Completed   HIV Screening  Completed   HPV VACCINES  Aged Out   COVID-19 Vaccine  Discontinued    Health Maintenance  Health Maintenance Due  Topic Date Due   Zoster Vaccines- Shingrix (1 of 2) Never done   Lung Cancer Screening  05/22/2020    Colonoscopy Patient Declined   Lung Cancer Screening: (Low Dose CT Chest recommended if Age 48-80 years, 30 pack-year currently smoking OR have quit w/in 15years.) does not qualify.   Additional Screening:  Hepatitis C Screening: does qualify; Completed 03/18/2016  Vision Screening: Recommended annual ophthalmology exams for early detection of glaucoma and other disorders of the eye. Is the patient up to date with their annual eye exam?  Yes  Who is the provider or what is the name of  the office in which the patient attends annual eye exams? [facility] If pt is not established with a provider, would they like to be referred to a provider to establish care? No .   Dental Screening:  Recommended annual dental exams for proper oral hygiene  Community Resource Referral / Chronic Care Management: CRR required this visit?  No   CCM required this visit?  No      Plan:     I have personally reviewed and noted the following in the patient's chart:   Medical and social history Use of alcohol, tobacco or illicit drugs  Current medications and supplements including opioid prescriptions. Patient is not currently taking opioid prescriptions. Functional ability and status Nutritional status Physical activity Advanced directives List of other physicians Hospitalizations, surgeries, and ER visits in previous 12 months Vitals Screenings to include cognitive, depression, and falls Referrals and appointments  In addition, I have reviewed and discussed with patient certain preventive protocols, quality metrics, and best practice recommendations. A written personalized care plan for preventive services as well as general preventive health recommendations were provided to patient.     Annabell Sabal, CMA   07/24/2022   Nurse Notes: None    I have reviewed the above information and agree with above.   Duncan Dull, MD

## 2022-07-24 NOTE — Patient Instructions (Signed)
[name] , Thank you for taking time to come for your Medicare Wellness Visit. I appreciate your ongoing commitment to your health goals. Please review the following plan we discussed and let me know if I can assist you in the future.   These are the goals we discussed:  Goals   None     This is a list of the screening recommended for you and due dates:  Health Maintenance  Topic Date Due   Zoster (Shingles) Vaccine (1 of 2) Never done   Screening for Lung Cancer  05/22/2020   Colon Cancer Screening  10/08/2022*   Complete foot exam   10/08/2022   Eye exam for diabetics  10/11/2022   Flu Shot  10/15/2022   Hemoglobin A1C  01/15/2023   Yearly kidney function blood test for diabetes  04/11/2023   Yearly kidney health urinalysis for diabetes  07/15/2023   Medicare Annual Wellness Visit  07/24/2023   DTaP/Tdap/Td vaccine (2 - Td or Tdap) 04/06/2032   Hepatitis C Screening: USPSTF Recommendation to screen - Ages 56-79 yo.  Completed   HIV Screening  Completed   HPV Vaccine  Aged Out   COVID-19 Vaccine  Discontinued  *Topic was postponed. The date shown is not the original due date.    Advanced directives: Advance directive discussed with you today. Even though you declined this today, please call our office should you change your mind, and we can give you the proper paperwork for you to fill out.   Conditions/risks identified: Diabetes Mellitus and Nutrition, Adult When you have diabetes, or diabetes mellitus, it is very important to have healthy eating habits because your blood sugar (glucose) levels are greatly affected by what you eat and drink. Eating healthy foods in the right amounts, at about the same times every day, can help you: Manage your blood glucose. Lower your risk of heart disease. Improve your blood pressure. Reach or maintain a healthy weight. What can affect my meal plan? Every person with diabetes is different, and each person has different needs for a meal  plan. Your health care provider may recommend that you work with a dietitian to make a meal plan that is best for you. Your meal plan may vary depending on factors such as: The calories you need. The medicines you take. Your weight. Your blood glucose, blood pressure, and cholesterol levels. Your activity level. Other health conditions you have, such as heart or kidney disease. How do carbohydrates affect me? Carbohydrates, also called carbs, affect your blood glucose level more than any other type of food. Eating carbs raises the amount of glucose in your blood. It is important to know how many carbs you can safely have in each meal. This is different for every person. Your dietitian can help you calculate how many carbs you should have at each meal and for each snack. How does alcohol affect me? Alcohol can cause a decrease in blood glucose (hypoglycemia), especially if you use insulin or take certain diabetes medicines by mouth. Hypoglycemia can be a life-threatening condition. Symptoms of hypoglycemia, such as sleepiness, dizziness, and confusion, are similar to symptoms of having too much alcohol. Do not drink alcohol if: Your health care provider tells you not to drink. You are pregnant, may be pregnant, or are planning to become pregnant. If you drink alcohol: Limit how much you have to: 0-1 drink a day for women. 0-2 drinks a day for men. Know how much alcohol is in your drink. In  the [place] one drink equals one 12 oz bottle of beer (355 mL), one 5 oz glass of wine (148 mL), or one 1 oz glass of hard liquor (44 mL). Keep yourself hydrated with water, diet soda, or unsweetened iced tea. Keep in mind that regular soda, juice, and other mixers may contain a lot of sugar and must be counted as carbs. What are tips for following this plan?  Reading food labels Start by checking the serving size on the Nutrition Facts label of packaged foods and drinks. The number of calories and the amount  of carbs, fats, and other nutrients listed on the label are based on one serving of the item. Many items contain more than one serving per package. Check the total grams (g) of carbs in one serving. Check the number of grams of saturated fats and trans fats in one serving. Choose foods that have a low amount or none of these fats. Check the number of milligrams (mg) of salt (sodium) in one serving. Most people should limit total sodium intake to less than 2,300 mg per day. Always check the nutrition information of foods labeled as "low-fat" or "nonfat." These foods may be higher in added sugar or refined carbs and should be avoided. Talk to your dietitian to identify your daily goals for nutrients listed on the label. Shopping Avoid buying canned, pre-made, or processed foods. These foods tend to be high in fat, sodium, and added sugar. Shop around the outside edge of the grocery store. This is where you will most often find fresh fruits and vegetables, bulk grains, fresh meats, and fresh dairy products. Cooking Use low-heat cooking methods, such as baking, instead of high-heat cooking methods, such as deep frying. Cook using healthy oils, such as olive, canola, or sunflower oil. Avoid cooking with butter, cream, or high-fat meats. Meal planning Eat meals and snacks regularly, preferably at the same times every day. Avoid going long periods of time without eating. Eat foods that are high in fiber, such as fresh fruits, vegetables, beans, and whole grains. Eat 4-6 oz (112-168 g) of lean protein each day, such as lean meat, chicken, fish, eggs, or tofu. One ounce (oz) (28 g) of lean protein is equal to: 1 oz (28 g) of meat, chicken, or fish. 1 egg.  cup (62 g) of tofu. Eat some foods each day that contain healthy fats, such as avocado, nuts, seeds, and fish. What foods should I eat? Fruits Berries. Apples. Oranges. Peaches. Apricots. Plums. Grapes. Mangoes. Papayas. Pomegranates. Kiwi.  Cherries. Vegetables Leafy greens, including lettuce, spinach, kale, chard, collard greens, mustard greens, and cabbage. Beets. Cauliflower. Broccoli. Carrots. Green beans. Tomatoes. Peppers. Onions. Cucumbers. Brussels sprouts. Grains Whole grains, such as whole-wheat or whole-grain bread, crackers, tortillas, cereal, and pasta. Unsweetened oatmeal. Quinoa. Brown or wild rice. Meats and other proteins Seafood. Poultry without skin. Lean cuts of poultry and beef. Tofu. Nuts. Seeds. Dairy Low-fat or fat-free dairy products such as milk, yogurt, and cheese. The items listed above may not be a complete list of foods and beverages you can eat and drink. Contact a dietitian for more information. What foods should I avoid? Fruits Fruits canned with syrup. Vegetables Canned vegetables. Frozen vegetables with butter or cream sauce. Grains Refined white flour and flour products such as bread, pasta, snack foods, and cereals. Avoid all processed foods. Meats and other proteins Fatty cuts of meat. Poultry with skin. Breaded or fried meats. Processed meat. Avoid saturated fats. Dairy Full-fat yogurt, cheese, or  milk. Beverages Sweetened drinks, such as soda or iced tea. The items listed above may not be a complete list of foods and beverages you should avoid. Contact a dietitian for more information. Questions to ask a health care provider Do I need to meet with a certified diabetes care and education specialist? Do I need to meet with a dietitian? What number can I call if I have questions? When are the best times to check my blood glucose? Where to find more information: American Diabetes Association: diabetes.org Academy of Nutrition and Dietetics: eatright.[employer] of Diabetes and Digestive and Kidney Diseases: StageSync.si Association of Diabetes Care & Education Specialists: diabeteseducator.org Summary It is important to have healthy eating habits because your blood sugar  (glucose) levels are greatly affected by what you eat and drink. It is important to use alcohol carefully. A healthy meal plan will help you manage your blood glucose and lower your risk of heart disease. Your health care provider may recommend that you work with a dietitian to make a meal plan that is best for you. This information is not intended to replace advice given to you by your health care provider. Make sure you discuss any questions you have with your health care provider. Document Revised: 10/04/2019 Document Reviewed: 10/04/2019 Elsevier Patient Education  2023 [employer].   Next appointment: Follow up in one year for your annual wellness visit   Preventive Care 40-64 Years, Male Preventive care refers to lifestyle choices and visits with your health care provider that can promote health and wellness. What does preventive care include? A yearly physical exam. This is also called an annual well check. Dental exams once or twice a year. Routine eye exams. Ask your health care provider how often you should have your eyes checked. Personal lifestyle choices, including: Daily care of your teeth and gums. Regular physical activity. Eating a healthy diet. Avoiding tobacco and drug use. Limiting alcohol use. Practicing safe sex. Taking low-dose aspirin every day starting at age 39. What happens during an annual well check? The services and screenings done by your health care provider during your annual well check will depend on your age, overall health, lifestyle risk factors, and family history of disease. Counseling  Your health care provider may ask you questions about your: Alcohol use. Tobacco use. Drug use. Emotional well-being. Home and relationship well-being. Sexual activity. Eating habits. Work and work Astronomer. Screening  You may have the following tests or measurements: Height, weight, and BMI. Blood pressure. Lipid and cholesterol levels. These may be  checked every 5 years, or more frequently if you are over 20 years old. Skin check. Lung cancer screening. You may have this screening every year starting at age 31 if you have a 30-pack-year history of smoking and currently smoke or have quit within the past 15 years. Fecal occult blood test (FOBT) of the stool. You may have this test every year starting at age 67. Flexible sigmoidoscopy or colonoscopy. You may have a sigmoidoscopy every 5 years or a colonoscopy every 10 years starting at age 35. Prostate cancer screening. Recommendations will vary depending on your family history and other risks. Hepatitis C blood test. Hepatitis B blood test. Sexually transmitted disease (STD) testing. Diabetes screening. This is done by checking your blood sugar (glucose) after you have not eaten for a while (fasting). You may have this done every 1-3 years. Discuss your test results, treatment options, and if necessary, the need for more tests with your health  care provider. Vaccines  Your health care provider may recommend certain vaccines, such as: Influenza vaccine. This is recommended every year. Tetanus, diphtheria, and acellular pertussis (Tdap, Td) vaccine. You may need a Td booster every 10 years. Zoster vaccine. You may need this after age 66. Pneumococcal 13-valent conjugate (PCV13) vaccine. You may need this if you have certain conditions and have not been vaccinated. Pneumococcal polysaccharide (PPSV23) vaccine. You may need one or two doses if you smoke cigarettes or if you have certain conditions. Talk to your health care provider about which screenings and vaccines you need and how often you need them. This information is not intended to replace advice given to you by your health care provider. Make sure you discuss any questions you have with your health care provider. Document Released: 03/29/2015 Document Revised: 11/20/2015 Document Reviewed: 01/01/2015 Elsevier Interactive Patient  Education  2017 [employer].  Fall Prevention in the Home Falls can cause injuries. They can happen to people of all ages. There are many things you can do to make your home safe and to help prevent falls. What can I do on the outside of my home? Regularly fix the edges of walkways and driveways and fix any cracks. Remove anything that might make you trip as you walk through a door, such as a raised step or threshold. Trim any bushes or trees on the path to your home. Use bright outdoor lighting. Clear any walking paths of anything that might make someone trip, such as rocks or tools. Regularly check to see if handrails are loose or broken. Make sure that both sides of any steps have handrails. Any raised decks and porches should have guardrails on the edges. Have any leaves, snow, or ice cleared regularly. Use sand or salt on walking paths during winter. Clean up any spills in your garage right away. This includes oil or grease spills. What can I do in the bathroom? Use night lights. Install grab bars by the toilet and in the tub and shower. Do not use towel bars as grab bars. Use non-skid mats or decals in the tub or shower. If you need to sit down in the shower, use a plastic, non-slip stool. Keep the floor dry. Clean up any water that spills on the floor as soon as it happens. Remove soap buildup in the tub or shower regularly. Attach bath mats securely with double-sided non-slip rug tape. Do not have throw rugs and other things on the floor that can make you trip. What can I do in the bedroom? Use night lights. Make sure that you have a light by your bed that is easy to reach. Do not use any sheets or blankets that are too big for your bed. They should not hang down onto the floor. Have a firm chair that has side arms. You can use this for support while you get dressed. Do not have throw rugs and other things on the floor that can make you trip. What can I do in the  kitchen? Clean up any spills right away. Avoid walking on wet floors. Keep items that you use a lot in easy-to-reach places. If you need to reach something above you, use a strong step stool that has a grab bar. Keep electrical cords out of the way. Do not use floor polish or wax that makes floors slippery. If you must use wax, use non-skid floor wax. Do not have throw rugs and other things on the floor that can make you  trip. What can I do with my stairs? Do not leave any items on the stairs. Make sure that there are handrails on both sides of the stairs and use them. Fix handrails that are broken or loose. Make sure that handrails are as long as the stairways. Check any carpeting to make sure that it is firmly attached to the stairs. Fix any carpet that is loose or worn. Avoid having throw rugs at the top or bottom of the stairs. If you do have throw rugs, attach them to the floor with carpet tape. Make sure that you have a light switch at the top of the stairs and the bottom of the stairs. If you do not have them, ask someone to add them for you. What else can I do to help prevent falls? Wear shoes that: Do not have high heels. Have rubber bottoms. Are comfortable and fit you well. Are closed at the toe. Do not wear sandals. If you use a stepladder: Make sure that it is fully opened. Do not climb a closed stepladder. Make sure that both sides of the stepladder are locked into place. Ask someone to hold it for you, if possible. Clearly mark and make sure that you can see: Any grab bars or handrails. First and last steps. Where the edge of each step is. Use tools that help you move around (mobility aids) if they are needed. These include: Canes. Walkers. Scooters. Crutches. Turn on the lights when you go into a dark area. Replace any light bulbs as soon as they burn out. Set up your furniture so you have a clear path. Avoid moving your furniture around. If any of your floors are  uneven, fix them. If there are any pets around you, be aware of where they are. Review your medicines with your doctor. Some medicines can make you feel dizzy. This can increase your chance of falling. Ask your doctor what other things that you can do to help prevent falls. This information is not intended to replace advice given to you by your health care provider. Make sure you discuss any questions you have with your health care provider. Document Released: 12/27/2008 Document Revised: 08/08/2015 Document Reviewed: 04/06/2014 Elsevier Interactive Patient Education  2017 [employer].

## 2022-08-12 DIAGNOSIS — Z79899 Other long term (current) drug therapy: Secondary | ICD-10-CM | POA: Diagnosis not present

## 2022-08-12 DIAGNOSIS — F209 Schizophrenia, unspecified: Secondary | ICD-10-CM | POA: Diagnosis not present

## 2022-09-09 DIAGNOSIS — F209 Schizophrenia, unspecified: Secondary | ICD-10-CM | POA: Diagnosis not present

## 2022-09-09 DIAGNOSIS — F419 Anxiety disorder, unspecified: Secondary | ICD-10-CM | POA: Diagnosis not present

## 2022-09-11 DIAGNOSIS — F209 Schizophrenia, unspecified: Secondary | ICD-10-CM | POA: Diagnosis not present

## 2022-09-11 DIAGNOSIS — Z79899 Other long term (current) drug therapy: Secondary | ICD-10-CM | POA: Diagnosis not present

## 2022-09-26 ENCOUNTER — Other Ambulatory Visit: Payer: Self-pay | Admitting: Family Medicine

## 2022-10-09 ENCOUNTER — Ambulatory Visit: Payer: PPO | Admitting: Family Medicine

## 2022-10-29 DIAGNOSIS — F209 Schizophrenia, unspecified: Secondary | ICD-10-CM | POA: Diagnosis not present

## 2022-12-28 DIAGNOSIS — F209 Schizophrenia, unspecified: Secondary | ICD-10-CM | POA: Diagnosis not present

## 2023-01-06 DIAGNOSIS — F419 Anxiety disorder, unspecified: Secondary | ICD-10-CM | POA: Diagnosis not present

## 2023-01-06 DIAGNOSIS — F209 Schizophrenia, unspecified: Secondary | ICD-10-CM | POA: Diagnosis not present

## 2023-01-19 ENCOUNTER — Encounter: Payer: Self-pay | Admitting: Family Medicine

## 2023-01-19 ENCOUNTER — Ambulatory Visit (INDEPENDENT_AMBULATORY_CARE_PROVIDER_SITE_OTHER): Payer: PPO | Admitting: Family Medicine

## 2023-01-19 VITALS — BP 128/84 | HR 79 | Temp 98.4°F | Ht 69.0 in | Wt 234.0 lb

## 2023-01-19 DIAGNOSIS — Z1329 Encounter for screening for other suspected endocrine disorder: Secondary | ICD-10-CM

## 2023-01-19 DIAGNOSIS — E785 Hyperlipidemia, unspecified: Secondary | ICD-10-CM | POA: Diagnosis not present

## 2023-01-19 DIAGNOSIS — Z23 Encounter for immunization: Secondary | ICD-10-CM | POA: Diagnosis not present

## 2023-01-19 DIAGNOSIS — Z0000 Encounter for general adult medical examination without abnormal findings: Secondary | ICD-10-CM | POA: Diagnosis not present

## 2023-01-19 DIAGNOSIS — Z125 Encounter for screening for malignant neoplasm of prostate: Secondary | ICD-10-CM

## 2023-01-19 DIAGNOSIS — Z794 Long term (current) use of insulin: Secondary | ICD-10-CM | POA: Diagnosis not present

## 2023-01-19 DIAGNOSIS — Z130 Encounter for screening for diseases of the blood and blood-forming organs and certain disorders involving the immune mechanism: Secondary | ICD-10-CM

## 2023-01-19 DIAGNOSIS — E119 Type 2 diabetes mellitus without complications: Secondary | ICD-10-CM

## 2023-01-19 LAB — COMPREHENSIVE METABOLIC PANEL
ALT: 16 U/L (ref 0–53)
AST: 14 U/L (ref 0–37)
Albumin: 4.4 g/dL (ref 3.5–5.2)
Alkaline Phosphatase: 55 U/L (ref 39–117)
BUN: 14 mg/dL (ref 6–23)
CO2: 27 meq/L (ref 19–32)
Calcium: 9.6 mg/dL (ref 8.4–10.5)
Chloride: 104 meq/L (ref 96–112)
Creatinine, Ser: 0.79 mg/dL (ref 0.40–1.50)
GFR: 102.03 mL/min (ref >60.00)
Glucose, Bld: 169 mg/dL — ABNORMAL HIGH (ref 70–99)
Potassium: 4.3 meq/L (ref 3.5–5.1)
Sodium: 140 meq/L (ref 135–145)
Total Bilirubin: 0.7 mg/dL (ref 0.2–1.2)
Total Protein: 6.6 g/dL (ref 6.0–8.3)

## 2023-01-19 LAB — CBC
HCT: 43.7 % (ref 39.0–52.0)
Hemoglobin: 14.5 g/dL (ref 13.0–17.0)
MCHC: 33.1 g/dL (ref 30.0–36.0)
MCV: 89.8 fL (ref 78.0–100.0)
Platelets: 197 10*3/uL (ref 150.0–400.0)
RBC: 4.87 Mil/uL (ref 4.22–5.81)
RDW: 13.7 % (ref 11.5–15.5)
WBC: 5.2 10*3/uL (ref 4.0–10.5)

## 2023-01-19 LAB — LIPID PANEL
Cholesterol: 120 mg/dL (ref 0–200)
HDL: 43.2 mg/dL (ref >39.00)
LDL Cholesterol: 49 mg/dL (ref 0–99)
NonHDL: 77.29
Total CHOL/HDL Ratio: 3
Triglycerides: 142 mg/dL (ref 0.0–149.0)
VLDL: 28.4 mg/dL (ref 0.0–40.0)

## 2023-01-19 LAB — PSA, MEDICARE: PSA: 0.25 ng/mL (ref 0.10–4.00)

## 2023-01-19 LAB — HEMOGLOBIN A1C: Hgb A1c MFr Bld: 6.7 % — ABNORMAL HIGH (ref 4.6–6.5)

## 2023-01-19 LAB — TSH: TSH: 1.18 u[IU]/mL (ref 0.35–5.50)

## 2023-01-19 NOTE — Patient Instructions (Signed)
Nice to see you. Please consider lung cancer screening with low-dose CT scan and consider getting a colonoscopy for colon cancer screening.  If you would like to do either of these please let us know.

## 2023-01-19 NOTE — Progress Notes (Signed)
Marikay Alar, MD Phone: [phone]  Bobby Clements is a 52 y.o. male who presents today for CPE.  Diet: generally healthy, does limit sweets, drinks several diet sodas per day, fried foods 1-2x/week Exercise: walks daily Colonoscopy: due, declines Prostate cancer screening: due Family history-  Prostate cancer: no  Colon cancer: no Vaccines-   Flu: given today  Tetanus: UTD  Shingles: due  COVID19: due HIV screening: UTD Hep C Screening: UTD Tobacco use: former smoker Alcohol use: no Illicit Drug use: no Dentist: yes Ophthalmology: yes   Active Ambulatory Problems    Diagnosis Date Noted   DM (diabetes mellitus), type 2 (HCC) 04/15/2016   Coronary artery calcification 04/15/2016   Paranoid schizophrenia (HCC)    Former smoker 07/13/2016   HLD (hyperlipidemia) 01/12/2018   Skin lesion 12/13/2019   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   Routine general medical examination at a health care facility 01/19/2023   Resolved Ambulatory Problems    Diagnosis Date Noted   Cough 04/14/2016   CAP (community acquired pneumonia) 04/15/2016   Splenomegaly 04/15/2016   Anemia 04/15/2016   Bronchiectasis (HCC) 07/13/2016   Epidermoid cyst 10/12/2016   Visit for suture removal 10/19/2016   Subcutaneous nodule 01/13/2017   Nausea 07/13/2017   Elevated blood pressure reading 01/12/2018   No Additional Past Medical History    Family History  Adopted: Yes    Social History   Socioeconomic History   Marital status: Single    Spouse name: Not on file   Number of children: Not on file   Years of education: Not on file   Highest education level: Not on file  Occupational History   Not on file  Tobacco Use   Smoking status: Former    Current packs/day: 0.00    Average packs/day: 1.5 packs/day for 32.0 years (48.0 ttl pk-yrs)    Types: Cigarettes    Start date: 84    Quit date: 2018    Years since quitting: 6.8   Smokeless tobacco: Former    Quit date: 12/14/2016   Vaping Use   Vaping status: Never Used  Substance and Sexual Activity   Alcohol use: No   Drug use: No   Sexual activity: Never    Partners: Female  Other Topics Concern   Not on file  Social History Narrative   Not on file   Social Determinants of Health   Financial Resource Strain: Low Risk  (07/24/2022)   Overall Financial Resource Strain (CARDIA)    Difficulty of Paying Living Expenses: Not hard at all  Food Insecurity: No Food Insecurity (07/24/2022)   Hunger Vital Sign    Worried About Running Out of Food in the Last Year: Never true    Ran Out of Food in the Last Year: Never true  Transportation Needs: No Transportation Needs (07/24/2022)   PRAPARE - Administrator, Civil Service (Medical): No    Lack of Transportation (Non-Medical): No  Physical Activity: Sufficiently Active (07/24/2022)   Exercise Vital Sign    Days of Exercise per Week: 7 days    Minutes of Exercise per Session: 30 min  Stress: No Stress Concern Present (07/24/2022)   [employer] of Occupational Health - Occupational Stress Questionnaire    Feeling of Stress : Not at all  Social Connections: Moderately Integrated (07/24/2022)   Social Connection and Isolation Panel [NHANES]    Frequency of Communication with Friends and Family: More than three times a week    Frequency of Social Gatherings  with Friends and Family: More than three times a week    Attends Religious Services: More than 4 times per year    Active Member of Clubs or Organizations: Yes    Attends Banker Meetings: More than 4 times per year    Marital Status: Never married  Intimate Partner Violence: Not At Risk (07/24/2022)   Humiliation, Afraid, Rape, and Kick questionnaire    Fear of Current or Ex-Partner: No    Emotionally Abused: No    Physically Abused: No    Sexually Abused: No    ROS  General:  Negative for nexplained weight loss, fever Skin: Negative for new or changing mole, sore that won't  heal HEENT: Negative for trouble hearing, trouble seeing, ringing in ears, mouth sores, hoarseness, change in voice, dysphagia. CV:  Negative for chest pain, dyspnea, edema, palpitations Resp: Negative for cough, dyspnea, hemoptysis GI: Negative for nausea, vomiting, diarrhea, constipation, abdominal pain, melena, hematochezia. GU: Negative for dysuria, incontinence, urinary hesitance, hematuria, vaginal or penile discharge, polyuria, sexual difficulty, lumps in testicle or breasts MSK: Negative for muscle cramps or aches, joint pain or swelling Neuro: Negative for headaches, weakness, numbness, dizziness, passing out/fainting Psych: Negative for depression, anxiety, memory problems  Objective  Physical Exam Vitals:   01/19/23 0942 01/19/23 0955  BP: 130/82 128/84  Pulse: 79   Temp: 98.4 F (36.9 C)   SpO2: 99%     BP Readings from Last 3 Encounters:  01/19/23 128/84  07/15/22 120/82  04/10/22 120/80   Wt Readings from Last 3 Encounters:  01/19/23 234 lb (106.1 kg)  07/24/22 230 lb (104.3 kg)  07/15/22 229 lb 6.4 oz (104.1 kg)    Physical Exam Constitutional:      General: He is not in acute distress.    Appearance: He is not diaphoretic.  HENT:     Head: Normocephalic and atraumatic.  Cardiovascular:     Rate and Rhythm: Normal rate and regular rhythm.     Heart sounds: Normal heart sounds.  Pulmonary:     Effort: Pulmonary effort is normal.     Breath sounds: Normal breath sounds.  Abdominal:     General: Bowel sounds are normal. There is no distension.     Palpations: Abdomen is soft.     Tenderness: There is no abdominal tenderness.  Musculoskeletal:     Right lower leg: No edema.     Left lower leg: No edema.  Lymphadenopathy:     Cervical: No cervical adenopathy.  Skin:    General: Skin is warm and dry.  Neurological:     Mental Status: He is alert.  Psychiatric:        Mood and Affect: Mood normal.      Assessment/Plan:   Routine general  medical examination at a health care facility Assessment & Plan: Physical exam completed.  Encouraged healthy diet and exercise.  PSA to be completed for prostate cancer screening.  Encouraged colonoscopy and lung cancer screening.  Patient notes he would consider both of those.  Encouraged him to get the Shingrix vaccine and updated COVID vaccination.  Advised he would have to get those at the pharmacy.  Lab work as outlined.   Encounter for administration of vaccine -     Flu vaccine trivalent PF, 6mos and older(Flulaval,Afluria,Fluarix,Fluzone)  Hyperlipidemia, unspecified hyperlipidemia type -     Comprehensive metabolic panel -     Lipid panel  Type 2 diabetes mellitus without complication, with long-term current use of  insulin (HCC) -     Hemoglobin A1c  Screening for deficiency anemia -     CBC  Thyroid disorder screen -     TSH  Prostate cancer screening -     PSA, Medicare    Return in about 6 months (around 07/19/2023) for Transfer of care.   Marikay Alar, MD [facility]

## 2023-01-19 NOTE — Assessment & Plan Note (Signed)
Physical exam completed.  Encouraged healthy diet and exercise.  PSA to be completed for prostate cancer screening.  Encouraged colonoscopy and lung cancer screening.  Patient notes he would consider both of those.  Encouraged him to get the Shingrix vaccine and updated COVID vaccination.  Advised he would have to get those at the pharmacy.  Lab work as outlined.

## 2023-02-08 DIAGNOSIS — F209 Schizophrenia, unspecified: Secondary | ICD-10-CM | POA: Diagnosis not present

## 2023-03-11 DIAGNOSIS — F209 Schizophrenia, unspecified: Secondary | ICD-10-CM | POA: Diagnosis not present

## 2023-04-12 DIAGNOSIS — F209 Schizophrenia, unspecified: Secondary | ICD-10-CM | POA: Diagnosis not present

## 2023-07-21 ENCOUNTER — Telehealth: Payer: Self-pay | Admitting: Family Medicine

## 2023-07-21 NOTE — Telephone Encounter (Signed)
 Your provider will not be in the tomorrow 07/22/23. Please call the office to reschedule @ [phone].

## 2023-07-22 ENCOUNTER — Ambulatory Visit: Payer: PPO | Admitting: Family Medicine

## 2023-07-23 ENCOUNTER — Ambulatory Visit: Admitting: Family Medicine

## 2023-07-23 ENCOUNTER — Encounter: Payer: Self-pay | Admitting: Family Medicine

## 2023-07-23 VITALS — BP 136/70 | HR 96 | Temp 98.0°F | Ht 69.0 in | Wt 237.6 lb

## 2023-07-23 DIAGNOSIS — E118 Type 2 diabetes mellitus with unspecified complications: Secondary | ICD-10-CM

## 2023-07-23 DIAGNOSIS — E1169 Type 2 diabetes mellitus with other specified complication: Secondary | ICD-10-CM

## 2023-07-23 DIAGNOSIS — E538 Deficiency of other specified B group vitamins: Secondary | ICD-10-CM | POA: Diagnosis not present

## 2023-07-23 DIAGNOSIS — E785 Hyperlipidemia, unspecified: Secondary | ICD-10-CM | POA: Diagnosis not present

## 2023-07-23 DIAGNOSIS — Z7984 Long term (current) use of oral hypoglycemic drugs: Secondary | ICD-10-CM

## 2023-07-23 LAB — COMPREHENSIVE METABOLIC PANEL WITH GFR
ALT: 17 U/L (ref 0–53)
AST: 14 U/L (ref 0–37)
Albumin: 4.4 g/dL (ref 3.5–5.2)
Alkaline Phosphatase: 61 U/L (ref 39–117)
BUN: 16 mg/dL (ref 6–23)
CO2: 28 meq/L (ref 19–32)
Calcium: 9.2 mg/dL (ref 8.4–10.5)
Chloride: 106 meq/L (ref 96–112)
Creatinine, Ser: 0.78 mg/dL (ref 0.40–1.50)
GFR: 102.06 mL/min (ref >60.00)
Glucose, Bld: 208 mg/dL — ABNORMAL HIGH (ref 70–99)
Potassium: 4.2 meq/L (ref 3.5–5.1)
Sodium: 141 meq/L (ref 135–145)
Total Bilirubin: 0.8 mg/dL (ref 0.2–1.2)
Total Protein: 6.5 g/dL (ref 6.0–8.3)

## 2023-07-23 LAB — MICROALBUMIN / CREATININE URINE RATIO
Creatinine,U: 149 mg/dL
Microalb Creat Ratio: 7.4 mg/g (ref 0.0–30.0)
Microalb, Ur: 1.1 mg/dL (ref 0.0–1.9)

## 2023-07-23 LAB — CBC WITH DIFFERENTIAL/PLATELET
Basophils Absolute: 0 10*3/uL (ref 0.0–0.1)
Basophils Relative: 0.7 % (ref 0.0–3.0)
Eosinophils Absolute: 0 10*3/uL (ref 0.0–0.7)
Eosinophils Relative: 0.1 % (ref 0.0–5.0)
HCT: 42.5 % (ref 39.0–52.0)
Hemoglobin: 13.9 g/dL (ref 13.0–17.0)
Lymphocytes Relative: 19.7 % (ref 12.0–46.0)
Lymphs Abs: 1 10*3/uL (ref 0.7–4.0)
MCHC: 32.8 g/dL (ref 30.0–36.0)
MCV: 89.5 fl (ref 78.0–100.0)
Monocytes Absolute: 0.3 10*3/uL (ref 0.1–1.0)
Monocytes Relative: 5.7 % (ref 3.0–12.0)
Neutro Abs: 3.7 10*3/uL (ref 1.4–7.7)
Neutrophils Relative %: 73.8 % (ref 43.0–77.0)
Platelets: 200 10*3/uL (ref 150.0–400.0)
RBC: 4.75 Mil/uL (ref 4.22–5.81)
RDW: 14.1 % (ref 11.5–15.5)
WBC: 5 10*3/uL (ref 4.0–10.5)

## 2023-07-23 LAB — LIPID PANEL
Cholesterol: 109 mg/dL (ref 0–200)
HDL: 44.4 mg/dL (ref >39.00)
LDL Cholesterol: 41 mg/dL (ref 0–99)
NonHDL: 64.42
Total CHOL/HDL Ratio: 2
Triglycerides: 115 mg/dL (ref 0.0–149.0)
VLDL: 23 mg/dL (ref 0.0–40.0)

## 2023-07-23 LAB — VITAMIN B12: Vitamin B-12: 605 pg/mL (ref 211–911)

## 2023-07-23 LAB — HEMOGLOBIN A1C: Hgb A1c MFr Bld: 7.3 % — ABNORMAL HIGH (ref 4.6–6.5)

## 2023-07-23 MED ORDER — ROSUVASTATIN CALCIUM 40 MG PO TABS
40.0000 mg | ORAL_TABLET | Freq: Every day | ORAL | 3 refills | Status: AC
Start: 2023-07-23 — End: 2024-02-02

## 2023-07-23 MED ORDER — METFORMIN HCL 500 MG PO TABS: 500.0000 mg | ORAL_TABLET | Freq: Two times a day (BID) | ORAL | 3 refills | Status: AC

## 2023-07-23 NOTE — Progress Notes (Unsigned)
 SUBJECTIVE:   Chief Complaint  Patient presents with   Medical Management of Chronic Issues    6 month follow-up   HPI Presents for follow up chronic disease management  Discussed the use of AI scribe software for clinical note transcription with the patient, who gave verbal consent to proceed.  History of Present Illness Bobby Clements is a 53 year old male with diabetes who presents for a follow-up visit.  He has a history of diabetes, managed with metformin  for the past seven years. His blood sugar levels at home range from 112 to 150 mg/dL. He takes metformin , one tablet twice a day, and requires a refill. No recent changes in diet.  He also takes medication for cholesterol management and received a refill for his cholesterol medication last week.  He has a history of smoking but quit over seven years ago. No chest pain, shortness of breath, or swelling in his legs.  He mentions having sores on his legs, which he attributes to scratching or contact with rocks while mowing the yard. He has not seen a dermatologist for these sores.  His psychiatric medications, Clozaril  and Prozac, are managed by Dr. Raeanne Bull Cyst, and he has never received prescriptions for these medications at this clinic.     PERTINENT PMH / PSH: As above  OBJECTIVE:  BP 136/70   Pulse 96   Temp 98 F (36.7 C) (Oral)   Ht 5\' 9"  (1.753 m)   Wt 237 lb 9.6 oz (107.8 kg)   SpO2 99%   BMI 35.09 kg/m    Physical Exam Vitals reviewed.  Constitutional:      General: He is not in acute distress.    Appearance: Normal appearance. He is obese. He is not ill-appearing, toxic-appearing or diaphoretic.  Eyes:     General:        Right eye: No discharge.        Left eye: No discharge.  Cardiovascular:     Rate and Rhythm: Normal rate and regular rhythm.     Heart sounds: Normal heart sounds.  Pulmonary:     Effort: Pulmonary effort is normal.     Breath sounds: Normal breath sounds.  Abdominal:      General: Bowel sounds are normal.  Musculoskeletal:        General: Normal range of motion.     Cervical back: Normal range of motion.  Skin:    General: Skin is warm and dry.  Neurological:     Mental Status: He is alert and oriented to person, place, and time. Mental status is at baseline.  Psychiatric:        Mood and Affect: Mood normal.        Behavior: Behavior normal.        Thought Content: Thought content normal.        Judgment: Judgment normal.           07/23/2023    9:42 AM 07/24/2022   10:29 AM 07/15/2022    9:35 AM 04/10/2022    9:00 AM 10/07/2021    8:15 AM  Depression screen PHQ 2/9  Decreased Interest 0 0 0 0 0  Down, Depressed, Hopeless 0 0 0 0 0  PHQ - 2 Score 0 0 0 0 0  Altered sleeping 0 0 0    Tired, decreased energy 0 0 0    Change in appetite 0 0 0    Feeling bad or failure about yourself  0  0 0    Trouble concentrating 0 0 0    Moving slowly or fidgety/restless 0 0 0    Suicidal thoughts 0 0 0    PHQ-9 Score 0 0 0    Difficult doing work/chores Not difficult at all Not difficult at all Not difficult at all        07/23/2023    9:43 AM 07/15/2022    9:35 AM 04/10/2022    9:00 AM  GAD 7 : Generalized Anxiety Score  Nervous, Anxious, on Edge 0 0 0  Control/stop worrying 0 0 0  Worry too much - different things 0 0 0  Trouble relaxing 0 0 0  Restless 0 0 0  Easily annoyed or irritable 0 0 0  Afraid - awful might happen 0 0 0  Total GAD 7 Score 0 0 0  Anxiety Difficulty Not difficult at all Not difficult at all Not difficult at all    ASSESSMENT/PLAN:  Type 2 diabetes mellitus with complications [facility]) Assessment & Plan: Type 2 diabetes mellitus managed with metformin . Home glucose levels 112-150 mg/dL, indicating reasonable control. - Refill metformin  500 mg BID - Order blood work to assess diabetes control.  Orders: -     metFORMIN  HCl; Take 1 tablet (500 mg total) by mouth 2 (two) times daily with a meal.  Dispense: 180 tablet; Refill: 3 -      Comprehensive metabolic panel with GFR -     Hemoglobin A1c -     Microalbumin / creatinine urine ratio  Hyperlipidemia associated with type 2 diabetes mellitus (HCC) Assessment & Plan: Hyperlipidemia managed with Crestor .  - Refill Crestor  prescription.  Orders: -     Rosuvastatin  Calcium ; Take 1 tablet (40 mg total) by mouth daily.  Dispense: 100 tablet; Refill: 3 -     Lipid panel  Vitamin B 12 deficiency -     CBC with Differential/Platelet -     Vitamin B12       PDMP reviewed  Return in about 6 months (around 01/23/2024).  Valli Gaw, MD

## 2023-07-23 NOTE — Patient Instructions (Signed)
 It was a pleasure meeting you today. Thank you for allowing me to take part in your health care.  Our goals for today as we discussed include:  Refill sent for requested medications  We will get some labs today.  If they are abnormal or we need to do something about them, I will call you.  If they are normal, I will send you a message on MyChart (if it is active) or a letter in the mail.  If you don't hear from us  in 2 weeks, please call the office at the number below.     This is a list of the screening recommended for you and due dates:  Health Maintenance  Topic Date Due   Zoster (Shingles) Vaccine (1 of 2) Never done   Colon Cancer Screening  Never done   Pneumococcal Vaccination (2 of 2 - PCV) 01/13/2018   Screening for Lung Cancer  05/22/2020   Complete foot exam   10/08/2022   Eye exam for diabetics  10/11/2022   Yearly kidney health urinalysis for diabetes  07/15/2023   Hemoglobin A1C  07/19/2023   Medicare Annual Wellness Visit  07/24/2023   Flu Shot  10/15/2023   Yearly kidney function blood test for diabetes  01/19/2024   DTaP/Tdap/Td vaccine (2 - Td or Tdap) 04/06/2032   Hepatitis C Screening  Completed   HIV Screening  Completed   HPV Vaccine  Aged Out   Meningitis B Vaccine  Aged Out   COVID-19 Vaccine  Discontinued      If you have any questions or concerns, please do not hesitate to call the office at [phone].  I look forward to our next visit and until then take care and stay safe.  Regards,   Valli Gaw, MD   [facility]

## 2023-07-27 ENCOUNTER — Ambulatory Visit: Payer: Self-pay | Admitting: Family Medicine

## 2023-07-27 DIAGNOSIS — E538 Deficiency of other specified B group vitamins: Secondary | ICD-10-CM | POA: Insufficient documentation

## 2023-07-27 NOTE — Assessment & Plan Note (Signed)
 Hyperlipidemia managed with Crestor .  - Refill Crestor  prescription.

## 2023-07-27 NOTE — Assessment & Plan Note (Signed)
 Type 2 diabetes mellitus managed with metformin . Home glucose levels 112-150 mg/dL, indicating reasonable control. - Refill metformin  500 mg BID - Order blood work to assess diabetes control.

## 2024-01-24 ENCOUNTER — Ambulatory Visit: Admitting: Internal Medicine

## 2024-02-02 ENCOUNTER — Telehealth: Payer: Self-pay

## 2024-02-02 ENCOUNTER — Encounter: Payer: Self-pay | Admitting: Internal Medicine

## 2024-02-02 ENCOUNTER — Ambulatory Visit (INDEPENDENT_AMBULATORY_CARE_PROVIDER_SITE_OTHER): Admitting: Internal Medicine

## 2024-02-02 VITALS — BP 138/82 | HR 99 | Temp 98.3°F | Ht 69.0 in | Wt 238.8 lb

## 2024-02-02 DIAGNOSIS — E1169 Type 2 diabetes mellitus with other specified complication: Secondary | ICD-10-CM | POA: Diagnosis not present

## 2024-02-02 DIAGNOSIS — Z7984 Long term (current) use of oral hypoglycemic drugs: Secondary | ICD-10-CM

## 2024-02-02 DIAGNOSIS — E785 Hyperlipidemia, unspecified: Secondary | ICD-10-CM

## 2024-02-02 DIAGNOSIS — Z0000 Encounter for general adult medical examination without abnormal findings: Secondary | ICD-10-CM

## 2024-02-02 DIAGNOSIS — E538 Deficiency of other specified B group vitamins: Secondary | ICD-10-CM | POA: Diagnosis not present

## 2024-02-02 DIAGNOSIS — E118 Type 2 diabetes mellitus with unspecified complications: Secondary | ICD-10-CM

## 2024-02-02 DIAGNOSIS — Z23 Encounter for immunization: Secondary | ICD-10-CM | POA: Diagnosis not present

## 2024-02-02 DIAGNOSIS — F200 Paranoid schizophrenia: Secondary | ICD-10-CM

## 2024-02-02 LAB — POCT GLYCOSYLATED HEMOGLOBIN (HGB A1C): Hemoglobin A1C: 7.3 % — AB (ref 4.0–5.6)

## 2024-02-02 LAB — MICROALBUMIN / CREATININE URINE RATIO
Creatinine,U: 80.3 mg/dL
Microalb Creat Ratio: UNDETERMINED mg/g (ref 0.0–30.0)
Microalb, Ur: <0.7 mg/dL

## 2024-02-02 NOTE — Assessment & Plan Note (Signed)
 Flu vaccine given today.

## 2024-02-02 NOTE — Assessment & Plan Note (Addendum)
-   Patient with a history of type 2 diabetes associated with hyperlipidemia -He is currently on metformin  5 mg twice daily -Last A1c was mildly elevated 7.3 -Will recheck A1c today -We will also check a CMP and urine microalbumin to creatinine ratio -No further workup at this time  Addendum: Patient's A1c done in the office today-was stable at 7.3 -Will continue with current medication regimen for now

## 2024-02-02 NOTE — Progress Notes (Signed)
 Acute Office Visit  Subjective:     Patient ID: Bobby Clements, male    DOB: 1970/10/22, 53 y.o.   MRN: [id number]  Chief Complaint  Patient presents with   Medical Management of Chronic Issues    Discussed the use of AI scribe software for clinical note transcription with the patient, who gave verbal consent to proceed.  History of Present Illness Bobby Clements is a 53 year old male with diabetes who presents for routine follow-up and blood work.  Glycemic control - Diabetes managed with metformin . - Blood glucose levels maintained between 110 and 140 mg/dL. - Last hemoglobin A1c recorded at 7.3%. - Due for blood work to reassess A1c and urine test for kidney function.  Cardiovascular status - Blood pressure is mildly elevated today at 144/82.  Repeat blood pressure was 138/82  Hyperlipidemia management - On medication for cholesterol (rosuvastatin ).  It appears to be well-controlled on this medication  Respiratory and infectious symptoms - No cough, cold, or fever.  Medication access - Able to access medications without difficulty. - Has two full bottles of medication available.  Immunization status - Has not smoked in seven years. - Due to receive influenza vaccine during this visit.    Review of Systems  Constitutional: Negative.   HENT: Negative.    Respiratory: Negative.    Cardiovascular: Negative.   Gastrointestinal: Negative.   Musculoskeletal: Negative.   Neurological: Negative.   Psychiatric/Behavioral: Negative.          Objective:    BP (!) 144/82   Pulse 99   Temp 98.3 F (36.8 C)   Ht 5' 9 (1.753 m)   Wt 238 lb 12.8 oz (108.3 kg)   SpO2 97%   BMI 35.26 kg/m    Physical Exam Constitutional:      Appearance: Normal appearance.  HENT:     Head: Normocephalic and atraumatic.  Cardiovascular:     Rate and Rhythm: Normal rate and regular rhythm.     Heart sounds: Normal heart sounds.  Pulmonary:     Effort: Pulmonary effort  is normal.     Breath sounds: Normal breath sounds. No wheezing, rhonchi or rales.  Abdominal:     General: Bowel sounds are normal. There is no distension.     Palpations: Abdomen is soft.     Tenderness: There is no abdominal tenderness. There is no guarding or rebound.  Musculoskeletal:        General: No swelling or tenderness.     Right lower leg: No edema.     Left lower leg: No edema.  Neurological:     Mental Status: He is alert.  Psychiatric:        Mood and Affect: Mood normal.        Behavior: Behavior normal.     Results for orders placed or performed in visit on 02/02/24  POCT glycosylated hemoglobin (Hb A1C)  Result Value Ref Range   Hemoglobin A1C 7.3 (A) 4.0 - 5.6 %   HbA1c POC (<> result, manual entry)     HbA1c, POC (prediabetic range)     HbA1c, POC (controlled diabetic range)          Assessment & Plan:   Problem List Items Addressed This Visit       Endocrine   Hyperlipidemia associated with type 2 diabetes mellitus (HCC)   - This problem is chronic and stable -Patient is currently on rosuvastatin  40 mg daily -Will recheck lipid panel today -  No further workup at this time      Relevant Orders   Lipid panel   POCT glycosylated hemoglobin (Hb A1C) (Completed)   Type 2 diabetes mellitus with complications (HCC)   - Patient with a history of type 2 diabetes associated with hyperlipidemia -He is currently on metformin  5 mg twice daily -Last A1c was mildly elevated 7.3 -Will recheck A1c today -We will also check a CMP and urine microalbumin to creatinine ratio -No further workup at this time  Addendum: Patient's A1c done in the office today-was stable at 7.3 -Will continue with current medication regimen for now        Relevant Orders   Microalbumin / creatinine urine ratio   Comprehensive metabolic panel with GFR     Other   Paranoid schizophrenia (HCC)   - This problems chronic and stable -Patient follows up with psychiatry for  this - He is currently on clozapine  and fluoxetine -Mood  and affect appear normal -No further workup at this time      Routine general medical examination at a health care facility   - Flu vaccine given today      Vitamin B 12 deficiency   - Patient has a history of vitamin B12 deficiency -Last vitamin B12 level done earlier this year was within normal limits -No indication to recheck at this time      Other Visit Diagnoses       Immunization due    -  Primary   Relevant Orders   Flu vaccine trivalent PF, 6mos and older(Flulaval,Afluria,Fluarix,Fluzone) (Completed)       No orders of the defined types were placed in this encounter.   No follow-ups on file.  Bobby Halleck, MD

## 2024-02-02 NOTE — Patient Instructions (Signed)
  VISIT SUMMARY: You had a follow-up appointment to check on your diabetes and overall health. We discussed your blood sugar levels, cholesterol, and general health maintenance. You also received your flu vaccine.  YOUR PLAN: -TYPE 2 DIABETES MELLITUS: Type 2 diabetes is a condition where your body does not use insulin  properly, leading to high blood sugar levels. Your diabetes is well-controlled with metformin , and your blood glucose levels are between 110 and 140 mg/dL. We have ordered tests to check your hemoglobin A1c and kidney function. Please continue taking metformin  as prescribed.  -HYPERLIPIDEMIA: Hyperlipidemia means you have high levels of fats (lipids) in your blood, which can increase your risk of heart disease. Your condition is managed with medication, and we have ordered a cholesterol level test to monitor it.  -GENERAL HEALTH MAINTENANCE: You received your flu vaccine today. This is important to help protect you from the influenza virus, especially during flu season.  - Hypertension-your blood pressure is mildly elevated today.  No indication for medication at this time.  Please follow-up with your PCP in January for repeat blood pressure check.  If it remains elevated she may start you on medication at that time  INSTRUCTIONS: Please go to the lab to have your blood work done, including tests for hemoglobin A1c, kidney function, and cholesterol levels. Continue taking your medications as prescribed. Schedule a follow-up appointment to discuss the results of your tests.                      Contains text generated by [employer].                                 Contains text generated by [employer].

## 2024-02-02 NOTE — Assessment & Plan Note (Signed)
-   This problem is chronic and stable -Patient is currently on rosuvastatin  40 mg daily -Will recheck lipid panel today -No further workup at this time

## 2024-02-02 NOTE — Telephone Encounter (Signed)
 Left message to call the office back regarding the Patient's A1c and BP.

## 2024-02-02 NOTE — Assessment & Plan Note (Signed)
-   Patient has a history of vitamin B12 deficiency -Last vitamin B12 level done earlier this year was within normal limits -No indication to recheck at this time

## 2024-02-02 NOTE — Assessment & Plan Note (Signed)
-   This problems chronic and stable -Patient follows up with psychiatry for this - He is currently on clozapine  and fluoxetine -Mood  and affect appear normal -No further workup at this time

## 2024-02-03 LAB — COMPREHENSIVE METABOLIC PANEL WITH GFR
ALT: 20 U/L (ref 0–53)
AST: 17 U/L (ref 0–37)
Albumin: 4.5 g/dL (ref 3.5–5.2)
Alkaline Phosphatase: 49 U/L (ref 39–117)
BUN: 13 mg/dL (ref 6–23)
CO2: 26 meq/L (ref 19–32)
Calcium: 9.2 mg/dL (ref 8.4–10.5)
Chloride: 105 meq/L (ref 96–112)
Creatinine, Ser: 0.75 mg/dL (ref 0.40–1.50)
GFR: 102.89 mL/min (ref >60.00)
Glucose, Bld: 136 mg/dL — ABNORMAL HIGH (ref 70–99)
Potassium: 4.3 meq/L (ref 3.5–5.1)
Sodium: 138 meq/L (ref 135–145)
Total Bilirubin: 0.6 mg/dL (ref 0.2–1.2)
Total Protein: 7 g/dL (ref 6.0–8.3)

## 2024-02-03 LAB — LIPID PANEL
Cholesterol: 111 mg/dL (ref 0–200)
HDL: 46.1 mg/dL (ref >39.00)
LDL Cholesterol: 39 mg/dL (ref 0–99)
NonHDL: 64.77
Total CHOL/HDL Ratio: 2
Triglycerides: 129 mg/dL (ref 0.0–149.0)
VLDL: 25.8 mg/dL (ref 0.0–40.0)

## 2024-02-04 ENCOUNTER — Ambulatory Visit: Payer: Self-pay | Admitting: Internal Medicine

## 2024-04-04 ENCOUNTER — Telehealth: Payer: Self-pay

## 2024-04-04 NOTE — Telephone Encounter (Signed)
 error

## 2024-04-10 ENCOUNTER — Encounter
